# Patient Record
Sex: Female | Born: 1985 | Race: Black or African American | Hispanic: No | Marital: Married
Health system: Midwestern US, Community
[De-identification: ages and names within clinical notes are randomized; demographics above are authoritative.]

---

## 2011-06-14 IMAGING — US US PELV - US TRANSVAGINAL
1 series · 14 of 25 positions shown · non-contrast
Comparison: None.

***ADDENDUM*** CREATED: 10/20/2010 [DATE]

Due to an administrative error, the original report had the
incorrect  exam title.  Please see below for the corrected exam
title.
US PELVIS COMPLETE & US TRANSVAGINAL NON-OB
***END ADDENDUM*** SIGNED BY: Flaco Mitra, M.D.
CLINICAL DATA: Right-sided pelvic pain.  Evaluate for torsion
RADIOLOGY EXAMINATION

[Series 1: us pelv - us transvaginal · 0.24mm/px · 75 acquisitions, 14 frames shown]
[im 1/75]
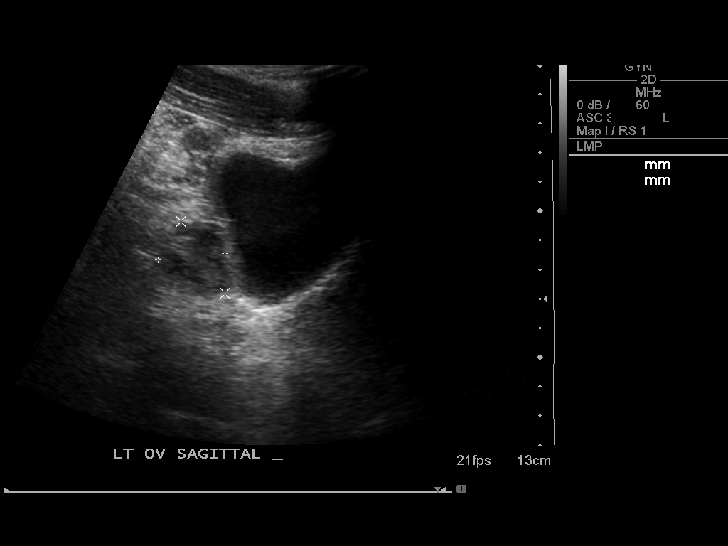
[im 7/75]
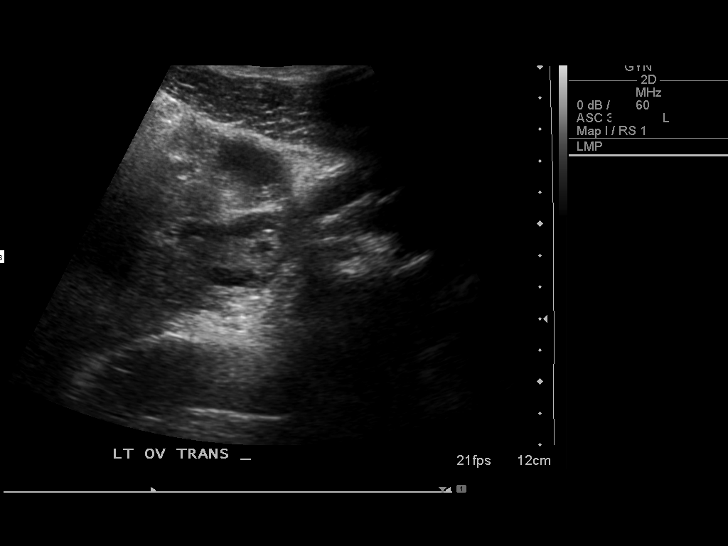
[im 13/75]
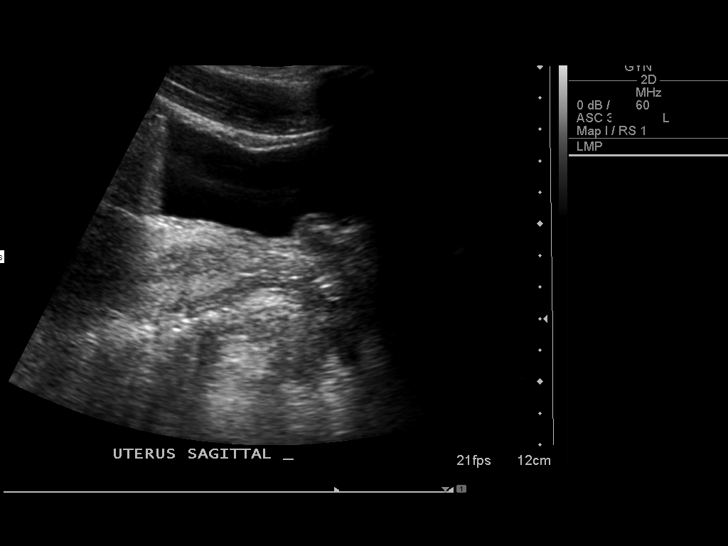
[im 19/75]
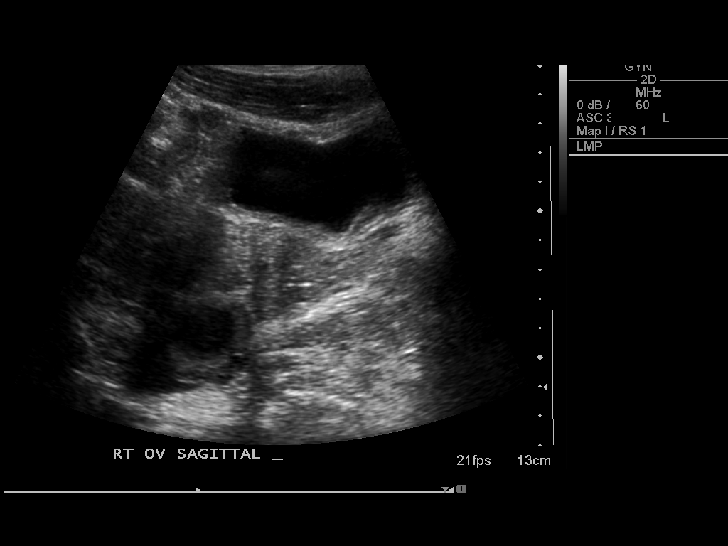
[im 25/75]
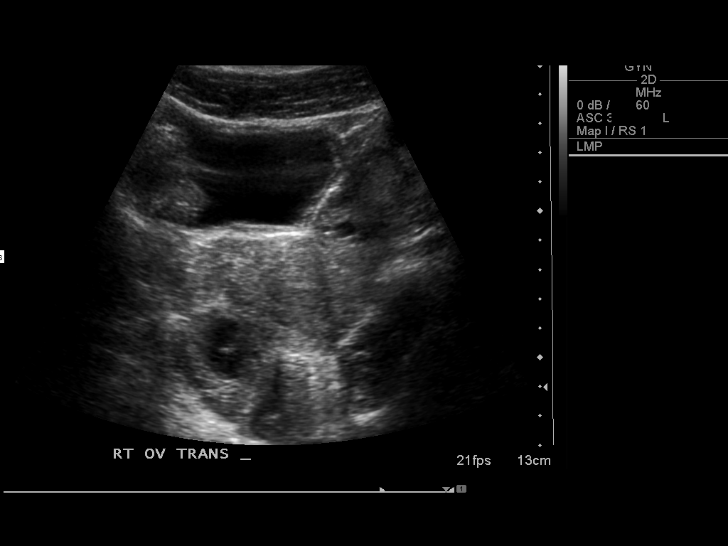
[im 28/75]
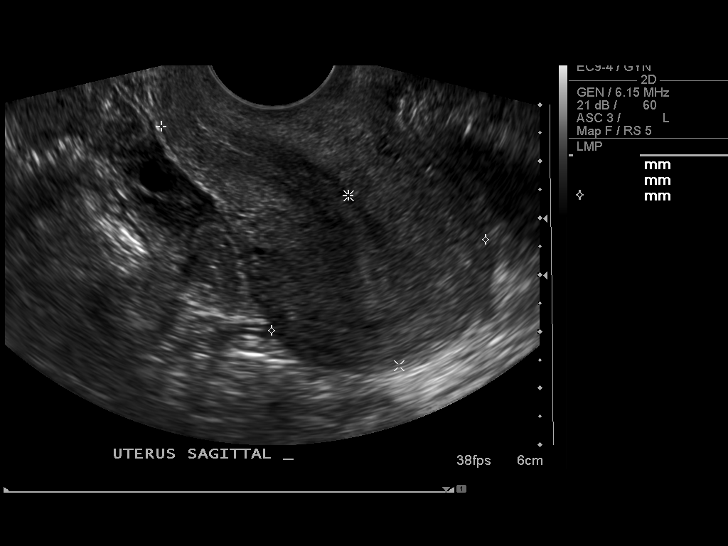
[im 34/75]
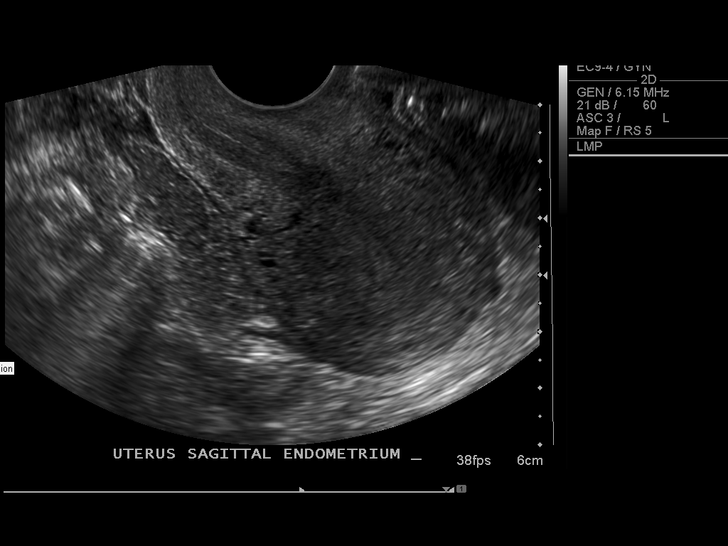
[im 41/75]
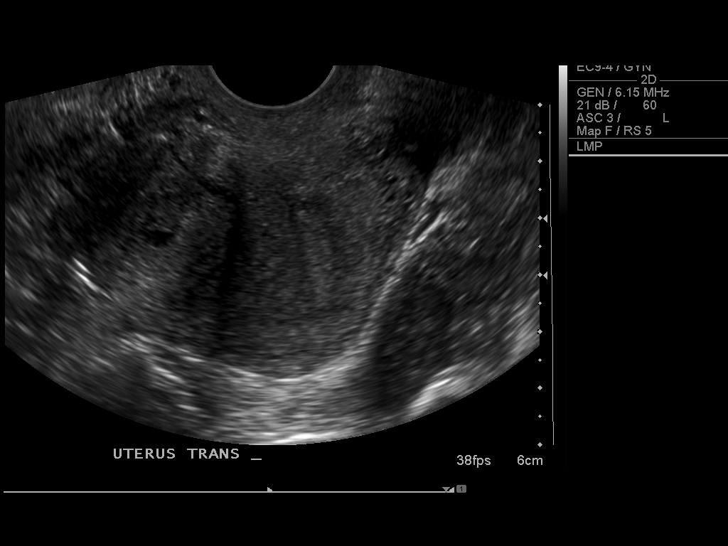
[im 47/75]
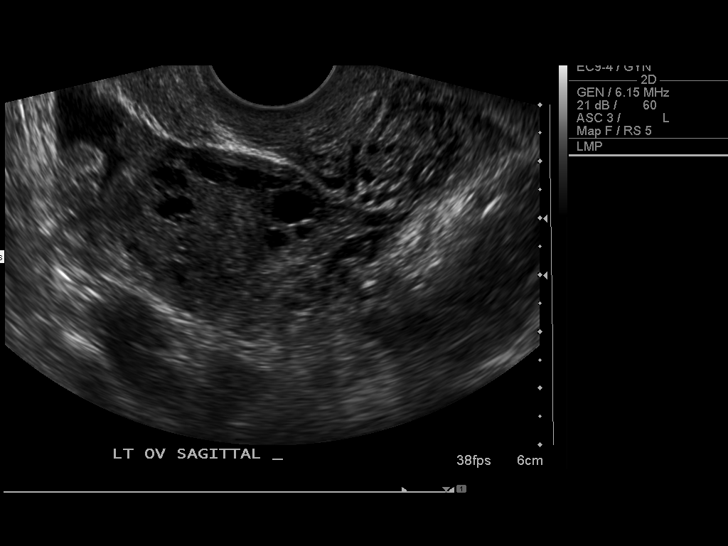
[im 50/75]
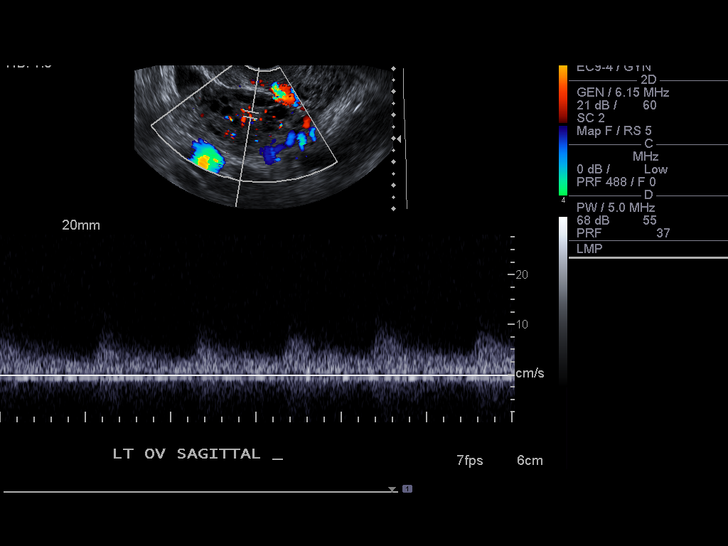
[im 56/75]
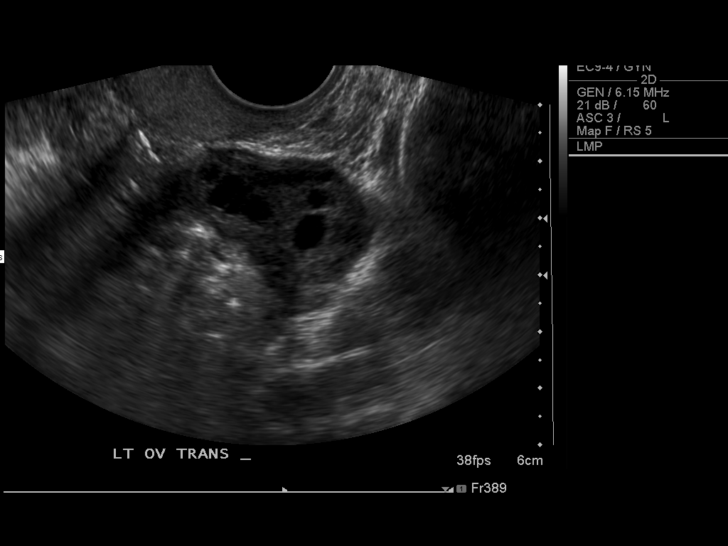
[im 62/75]
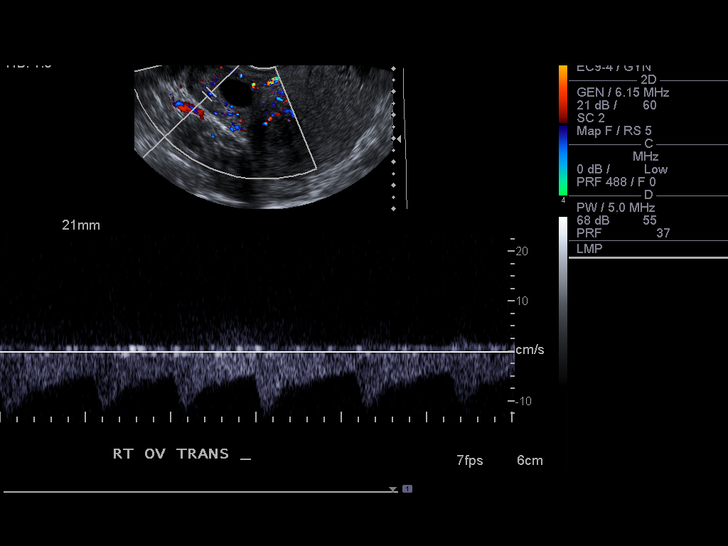
[im 68/75]
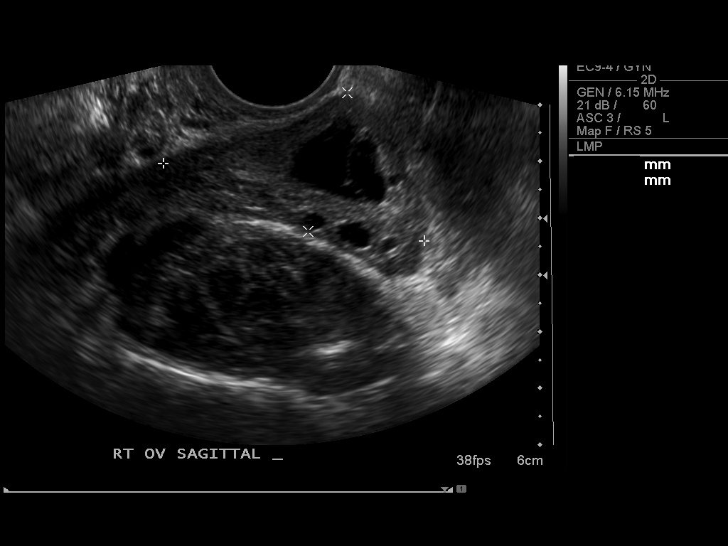
[im 75/75]
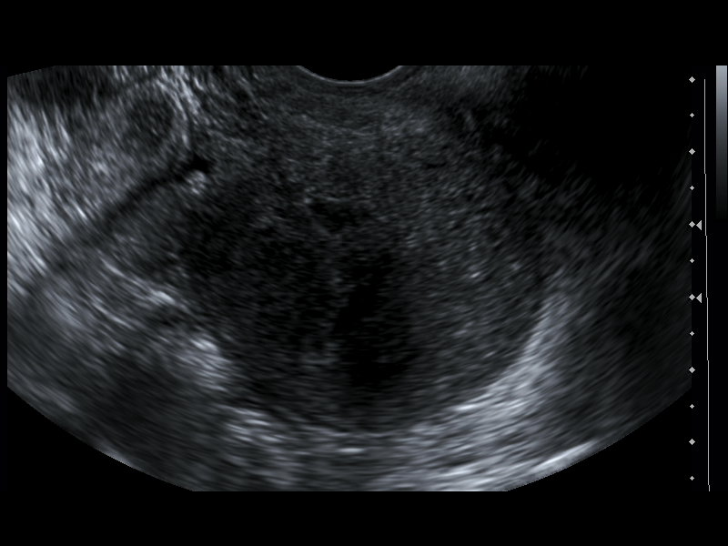

[14 of 25 positions shown; findings below may reference images not displayed]

FINDINGS: Uterus:  Normal in size measuring 6.6 x 4.1 x 5.2 cm.  A
3.2 x 2.9 x 2.4 cm heterogeneously hypoechoic mass is seen along
the right mid to superior body of the uterus displacing the
myometrium compatible with a fibroid.  No other masses are seen.

Right ovary:  4.8 x 2.5 x 3.3 cm.  There is a 2.6 cm anechoic mass
with thin internal septations seen in the right ovary.  Arterial
venous flow is seen in the right ovary.

Left ovary:  2.5 x 3.9 x 3.5 cm.  Normal.  Arterial venous flow is
seen.

No free fluid is seen.
IMPRESSION: 1.  No evidence of ovarian torsion.
2.  Myometrial fibroid as detailed above.
3.  Minimally complex right ovarian cyst likely related to
resolving hemorrhagic cyst.  If there is persistent pain, a repeat
ultrasound may be of use in 6-8 weeks.

## 2013-08-01 ENCOUNTER — Inpatient Hospital Stay
Admit: 2013-08-01 | Discharge: 2013-08-02 | Disposition: A | Discharge status: Home / Self Care | Payer: PRIVATE HEALTH INSURANCE | Source: Other Acute Inpatient Hospital | Attending: Psychiatry | Admitting: Psychiatry

## 2013-08-01 DIAGNOSIS — F4325 Adjustment disorder with mixed disturbance of emotions and conduct: Secondary | ICD-10-CM

## 2013-08-01 NOTE — Progress Notes (Signed)
Chest x ray completed.
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
## 2013-08-01 NOTE — Behavioral Health Treatment Team (Cosign Needed)
patiebnt did not participate in goals group being admitted to unit
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
## 2013-08-01 NOTE — Behavioral Health Treatment Team (Cosign Needed)
Patient did not participate in community meeting

## 2013-08-01 NOTE — Behavioral Health Treatment Team (Signed)
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

## 2013-08-01 NOTE — Behavioral Health Treatment Team (Signed)
nsg note  Client has been in her room most of the shift.  Did have a tellephone call that she got somewhat upset with, but calmed on down and has been watching TV and talking with peers.  Q 15 min checks for safety continue.

## 2013-08-02 LAB — LIPID PANEL
CHOL/HDL Ratio: 2.1 (ref 0–5.0)
Cholesterol, total: 181 MG/DL (ref <200)
HDL Cholesterol: 86 MG/DL
LDL, calculated: 84 MG/DL (ref 0–100)
Triglyceride: 55 MG/DL (ref <150)
VLDL, calculated: 11 MG/DL

## 2013-08-02 LAB — METABOLIC PANEL, COMPREHENSIVE
A-G Ratio: 1.2 (ref 1.1–2.2)
ALT (SGPT): 17 U/L (ref 12–78)
AST (SGOT): 14 U/L — ABNORMAL LOW (ref 15–37)
Albumin: 3.8 g/dL (ref 3.5–5.0)
Alk. phosphatase: 69 U/L (ref 45–117)
Anion gap: 10 mmol/L (ref 5–15)
BUN/Creatinine ratio: 29 — ABNORMAL HIGH (ref 12–20)
BUN: 26 MG/DL — ABNORMAL HIGH (ref 6–20)
Bilirubin, total: 0.7 MG/DL (ref 0.2–1.0)
CO2: 28 mmol/L (ref 21–32)
Calcium: 9.2 MG/DL (ref 8.5–10.1)
Chloride: 103 mmol/L (ref 97–108)
Creatinine: 0.9 MG/DL (ref 0.45–1.15)
GFR est AA: >60 mL/min/{1.73_m2} (ref >60)
GFR est non-AA: >60 mL/min/{1.73_m2} (ref >60)
Globulin: 3.2 g/dL (ref 2.0–4.0)
Glucose: 90 mg/dL (ref 65–100)
Potassium: 4.1 mmol/L (ref 3.5–5.1)
Protein, total: 7 g/dL (ref 6.4–8.2)
Sodium: 141 mmol/L (ref 136–145)

## 2013-08-02 LAB — CBC W/O DIFF
HCT: 38.6 % (ref 35.0–47.0)
HGB: 13.1 g/dL (ref 11.5–16.0)
MCH: 31.3 PG (ref 26.0–34.0)
MCHC: 33.9 g/dL (ref 30.0–36.5)
MCV: 92.1 FL (ref 80.0–99.0)
PLATELET: 256 10*3/uL (ref 150–400)
RBC: 4.19 M/uL (ref 3.80–5.20)
RDW: 12.6 % (ref 11.5–14.5)
WBC: 5.8 10*3/uL (ref 3.6–11.0)

## 2013-08-02 LAB — TSH 3RD GENERATION: TSH: 1.74 u[IU]/mL (ref 0.36–3.74)

## 2013-08-02 LAB — GLUCOSE, FASTING: Glucose: 90 MG/DL (ref 65–100)

## 2013-08-02 MED ADMIN — ibuprofen (MOTRIN) tablet 400 mg: ORAL | NDC 63739067210

## 2013-08-02 MED FILL — IBUPROFEN 400 MG TAB: 400 mg | ORAL | Qty: 1

## 2013-08-02 NOTE — Discharge Summary (Signed)
Patient seen, staffing held and discharge summary dictated: # [id number]  Please see dictated report for full details. Patient stable for discharge and considered to be at low risk of harm to self or others. Pt denies s/i and h/i. Pt fully contracts for safety. Pt reports many positive predictive factors in terms of not attempting suicide or homicide. Informed consent given to the use of discharge medications.  Treatment rounds held in full. All those in treatment rounds   concur with plans for discharge today.     I have spent greater than 35 minutes on discharge work.

## 2013-08-02 NOTE — Behavioral Health Treatment Team (Cosign Needed)
GROUP THERAPY PROGRESS NOTE    Sheila CowingMarkia Edwards is participating in [employer].     Group time: 30 minutes    Personal goal for participation: reality orientation    Goal orientation: social    Group therapy participation: active    Therapeutic interventions reviewed and discussed: yes    Impression of participation: no problem

## 2013-08-02 NOTE — Behavioral Health Treatment Team (Addendum)
Social Work     Pt will be discharged after her TDO hearing. Pt was alert and oriented. Pt denies SI/HI. Pt denies auditory hallucinations. Pt's mood was appropriate. Pt's thoughts were clear and logical. Pt reported  that her friend was going to take possession of gun when she is discharged. Clinician spoke with her friend Leavy CellaJasmine [phone] and she confirmed the plan. Pt was not on amy medications during this hospitalization. Pt will be given community referral if needed    Follow-up   [facility]   [address]  [place], [place] [id number]  [phone]

## 2013-08-02 NOTE — Behavioral Health Treatment Team (Signed)
Art Therapy Group    Michaell CowingMarkia Hartlage attended Art Therapy Group    Group Time: 45 minutes    Treatment Goal for Each Participant: Explore a new way of stress relief by experimenting with more sophisticated art materials including artist's chalk,charcoal, and pastels.    Participation    active    Engagement with Peers   moderate     Impression of Participation: Patient completed two colorful drawings in pastels. Patient eager for discharge today and reported that she is changing the nature of some relationships that have been unhealthy and lead her to angry outbursts and "saying what I do not mean".
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
## 2013-08-02 NOTE — Behavioral Health Treatment Team (Cosign Needed)
GROUP THERAPY PROGRESS NOTE    The patient Michaell CowingMarkia Leidner a 28 y.o. female is participating in Coping Skills Group.     Group time: 45 minutes    Personal goal for participation: To participate in relaxation activity    Goal orientation:  relaxation    Group therapy participation: active    Therapeutic interventions reviewed and discussed: favorite ways to relax    Impression of participation:  The patient was attentive.    BEVERLY S BAKER  08/02/2013  11:54 AM

## 2013-08-02 NOTE — Behavioral Health Treatment Team (Signed)
Pt rested quietly in bed with eyes closed.  No signs/symptoms of distress.  Will monitor for changes.  Q 15 minute checks continue.  2 hour chart audit done. Slept  7   hours since 2200

## 2013-08-02 NOTE — Behavioral Health Treatment Team (Cosign Needed)
GROUP THERAPY PROGRESS NOTE    Sheila CowingMarkia Edwards is participating in Goals Group.     Group time: 30 minutes    Personal goal for participation: to set a daily goal    Goal orientation: personal    Group therapy participation: active    Therapeutic interventions reviewed and discussed: yes    Impression of participation: cooperative

## 2013-08-02 NOTE — Progress Notes (Signed)
Pt is alert and oriented x person, place and time. Pt denies any SI/HI or AV hallucinations. Pt denies any depression. Pt plans to return to continue recommended plan of care. Discharge information reviewed with patient. Pt verbalizes understanding. Pt's belongings/valuables returned. Pt to be transported home by a friend.

## 2013-08-04 NOTE — Behavioral Health Treatment Team (Signed)
Social Work    [facility] [place] [facility].

## 2016-09-18 ENCOUNTER — Inpatient Hospital Stay
Admit: 2016-09-18 | Discharge: 2016-09-18 | Disposition: A | Discharge status: Home / Self Care | Payer: PRIVATE HEALTH INSURANCE | Attending: Emergency Medicine

## 2016-09-18 DIAGNOSIS — F329 Major depressive disorder, single episode, unspecified: Secondary | ICD-10-CM

## 2016-09-18 NOTE — ED Notes (Signed)
4:08 PM  09/18/16     Discharge instructions given to patient (name) with verbalization of understanding. Patient accompanied by family.  Patient discharged with the following prescriptions none. Patient discharged to home (destination).      Johny SaxJennifer L. Guy, RN

## 2016-09-18 NOTE — Progress Notes (Signed)
[facility]-ER CRISIS MENTAL HEALTH SCREENING: This 31 yo single Afro-American female was encouraged to come to this ER by her mother. 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 The patient lives with her female partner.  PROBLEMS: Reportedly the patient has a history of having "emotional highs and emotional lows". The patient's mother reports that she has been getting concerned over the patient's increased frequency and increased intensity of depressed episodes. The mother reports that the patient's female partner expressed concerns to the patient's mother via phone call. Reportedly, yesterday, the patient came home from work and presented depressed mood. Reportedly the patient got emotionally upset when the patient's female friend tried to be verbally supportive. Reportedly the patient started to hit self in the face out of frustration. The patient was quick to tears as she voiced her frustration of not having any control over her mood swings. The patient relates her frustration at the fact that there is no obvious trigger or situational stress that she can be corrected and then everything would be alright. The patient denies any suicidal or homicidal ideas. The patient reportedly is currently seen every several month for Rx psychotropic medication management by the psychiatrist, Dr. Marina GravelAlatise. The patient is also in outpatient psychotherapy through her employer [employer] program. The patient has missed her outpatient psychotherapy (every 2 week) session with Dr Hilliard ClarkVickie Hawkins-Black for the past 2 months. The patient reports belief that the currently Rx Abilify 15 mg po every day, is not effective in controlling her mood swings, especily the frequent depressed mood. The patient reports that the depressed moods are getting more difficulty to "shake off"  There is no evidence of any thought disorder or alterations in perceptions. The patient has a history  of being TDO into inpatient psychiatric intervention for suicidal statements on 08-01-13 to 08-02-13. After being admitted to inpatient psychiatric admission, the patient rescinded her complaint and denied being suicidal and patient was discharged home with outpatient psych follow-up.  IMPRESSION AND RISK ASSESSMENT: Writer discussed the patient with the ER physician. The patient describes a history of mood swings. The patient is currently experiencing depression with increased frequency and increased intensity. The patient voices frustration at not being able to remiss her depressed mood. The patient denies any suicidal or homicidal ideas. The patient is already in outpatient psychiatric psychotropic medication management with a psychiatrist and the patient has taken the initiative to get an outpatient appointment with her psychiatrist tomorrow. The patient reports belief that her current Rx medication regime is no effective in stabilization of her mood. The patient has missed her, every 2 weeks, outpatient psychotherapy session with her therapist for the past 2 months. The patient has also taken the initiative to schedule an outpatient psychotherapy appointment with therapist for next Thursday, 09-24-16.  PLAN: Clinical research associateWriter did provide the patient with other local psychiatric services since the [facility].

## 2016-09-18 NOTE — ED Triage Notes (Signed)
Pt states has been feeling depressed for past week, states has hx of depression and currently takes medications.  Denies SI/HI.  Mother with patient.

## 2016-09-18 NOTE — ED Provider Notes (Signed)
[facility]  [facility] Treatment Report    Patient: Sheila Edwards Age: 31 y.o. Sex: female    Date of Birth: [birth year] Admit Date: 09/18/2016 PCP: None   MRN: [id number]  CSN: [id number]     Room: ER17/ER17 Time Dictated: 1:08 PM        Chief Complaint   Depression   History of Present Illness   31 y.o. female presents to the ED with acute on chronic depression for 3-4 days. Patient states she came into the ED because she was not feeling very happy all week. She states that she feels depressed every couple of weeks. She notes that stressors make her depression worse such as money and work. 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 Her mother states that the patient was found hitting herself in the face this morning and the mother wanted to being her here but she drove off to the beach. Once the mother found the patient she agreed to be seen by a counselor for further evaluation. Patient states hitting herself makes her feel better but she denies cutting herself. She denies having any physical pain, SI, and HI. Patient has been on medication for her depression for 6 months. Denies use of drugs or drinking EtOH.       Review of Systems   Review of Systems   Constitutional: Negative for chills, diaphoresis, fever and weight loss.   HENT: Negative for congestion and sore throat.    Eyes: Negative for blurred vision and double vision.   Respiratory: Negative for cough, sputum production, shortness of breath and wheezing.    Cardiovascular: Negative for chest pain, palpitations, orthopnea and leg swelling.   Gastrointestinal: Negative for abdominal pain, blood in stool, diarrhea, melena, nausea and vomiting.   Genitourinary: Negative for dysuria, frequency and urgency.   Musculoskeletal: Negative for back pain and falls.   Skin: Negative for rash.   Neurological: Negative for dizziness, tingling and headaches.    Endo/Heme/Allergies: Does not bruise/bleed easily.   Psychiatric/Behavioral: Positive for depression. Negative for substance abuse and suicidal ideas.        Negative for homicidal ideations  Positive for self harming including hitting herself but does not cut herself        Past Medical/Surgical History     Past Medical History:   Diagnosis Date   ??? Depression    ??? Psychiatric disorder      History reviewed. No pertinent surgical history.    Social History     Social History     Social History   ??? Marital status: SINGLE     Spouse name: N/A   ??? Number of children: N/A   ??? Years of education: N/A     Social History Main Topics   ??? Smoking status: Never Smoker   ??? Smokeless tobacco: None   ??? Alcohol use None   ??? Drug use: None   ??? Sexual activity: Not Asked     Other Topics Concern   ??? None     Social History Narrative   ??? None       Family History   History reviewed. No pertinent family history.    Current Medications     Current Outpatient Prescriptions   Medication Sig Dispense Refill   ??? ARIPIPRAZOLE (ABILIFY PO) Take  by mouth.         Allergies   No Known Allergies  Physical Exam     Visit Vitals   ??? BP 124/77 (BP 1 Location: Left arm, BP Patient Position: At rest)   ??? Pulse 97   ??? Temp 98.9 ??F (37.2 ??C)   ??? Resp 15   ??? Ht 5\' 2"  (1.575 m)   ??? Wt 54.9 kg (121 lb)   ??? LMP 09/17/2016   ??? SpO2 97%   ??? BMI 22.13 kg/m2     Physical Exam   Constitutional: She is oriented to person, place, and time and well-developed, well-nourished, and in no distress. No distress.   HENT:   Head: Normocephalic and atraumatic.   No periorbital tenderness  Good dentition  No bleeding noted   Eyes: Conjunctivae and EOM are normal. Pupils are equal, round, and reactive to light.   Normal orbital tenderness   Neck: Normal range of motion. Neck supple. No tracheal deviation present. No thyromegaly present.   No neck pain    Cardiovascular: Normal rate, regular rhythm and normal heart sounds.  Exam  reveals no gallop and no friction rub.    No murmur heard.  Pulmonary/Chest: Effort normal and breath sounds normal. No stridor. No respiratory distress. She has no wheezes. She has no rales.   Lungs are clear to ausculation bilaterally.     Abdominal: Soft. Bowel sounds are normal. She exhibits no mass. There is no tenderness. There is no rebound and no guarding.   Musculoskeletal: Normal range of motion. She exhibits no edema or tenderness.   Equal bilateral motor strength   Lymphadenopathy:     She has no cervical adenopathy.   Neurological: She is alert and oriented to person, place, and time.   Skin: Skin is warm and dry. No rash noted. She is not diaphoretic.   Nursing note and vitals reviewed.      Impression and Management Plan   31 year old female with recurrent depression here today with depressive symptoms, she is not suicidal, she is not homicidal, she is just plain old sad.  She does have some putting things in her life, she enjoys working with some of the children at her work, she does go to [employer] and that makes her feel happy.  I do not feel she requires hospitalization, we will have our crisis team come and evaluate her, likely will need expedited outpatient follow-up.    Diagnostic Studies   Lab:   No results found for this or any previous visit (from the past 12 hour(s)).    Imaging:    No results found.    Medical Decision Making/ED Course   Patient was seen by Gerline Legacy, our crisis clinician, agrees that expeditious outpatient follow-up is reasonable, patient is nonsuicidal, non-homicidal for him, has follow-up scheduled for tomorrow and a first thing next week, and I feel that is reasonable, advised return to the ER for any suicidal or intrusive concerning thoughts.    Final Diagnosis       ICD-10-CM ICD-9-CM   1. Depression, unspecified depression type F32.9 311     Disposition   Discharge to home    Scribe Attestation      [employer] acting as a Neurosurgeon for and in the presence of Wynelle Bourgeois, MD      September 18, 2016 at 1:08 PM       Provider Attestation:      I personally performed the services described in the documentation, reviewed the documentation, as recorded by the scribe in my presence, and  it accurately and completely records my words and actions. September 18, 2016 at 1:08 PM - Wynelle Bourgeois, MD        Wynelle Bourgeois, MD  September 19, 2016    My signature above authenticates this document and my orders, the final ??  diagnosis (es), discharge prescription (s), and instructions in the Epic ??  record.  If you have any questions please contact [phone].  ??  Nursing notes have been reviewed by the physician/ advanced practice ??  Clinician.

## 2017-10-13 ENCOUNTER — Inpatient Hospital Stay
Admit: 2017-10-13 | Discharge: 2017-10-13 | Disposition: A | Discharge status: Home / Self Care | Payer: PRIVATE HEALTH INSURANCE | Attending: Emergency Medicine

## 2017-10-13 DIAGNOSIS — N946 Dysmenorrhea, unspecified: Secondary | ICD-10-CM

## 2017-10-13 LAB — LIPASE: Lipase: 242 U/L (ref 73–393)

## 2017-10-13 LAB — CBC WITH AUTOMATED DIFF
BASOPHILS: 0.3 % (ref 0–3)
EOSINOPHILS: 0.1 % (ref 0–5)
HCT: 38.9 % (ref 37.0–50.0)
HGB: 13.5 gm/dl (ref 13.0–17.2)
IMMATURE GRANULOCYTES: 0.4 % (ref 0.0–3.0)
LYMPHOCYTES: 7.9 % — ABNORMAL LOW (ref 28–48)
MCH: 32.3 pg (ref 25.4–34.6)
MCHC: 34.7 gm/dl (ref 30.0–36.0)
MCV: 93.1 fL (ref 80.0–98.0)
MONOCYTES: 4 % (ref 1–13)
MPV: 9.3 fL (ref 6.0–10.0)
NEUTROPHILS: 87.3 % — ABNORMAL HIGH (ref 34–64)
NRBC: 0 (ref 0–0)
PLATELET: 231 10*3/uL (ref 140–450)
RBC: 4.18 M/uL (ref 3.60–5.20)
RDW-SD: 46.1 (ref 36.4–46.3)
WBC: 14.8 10*3/uL — ABNORMAL HIGH (ref 4.0–11.0)

## 2017-10-13 LAB — POC URINE MICROSCOPIC

## 2017-10-13 LAB — METABOLIC PANEL, COMPREHENSIVE
ALT (SGPT): 62 U/L (ref 12–78)
AST (SGOT): 81 U/L — ABNORMAL HIGH (ref 15–37)
Albumin: 4.9 gm/dl (ref 3.4–5.0)
Alk. phosphatase: 85 U/L (ref 45–117)
Anion gap: 10 mmol/L (ref 5–15)
BUN: 20 mg/dl (ref 7–25)
Bilirubin, total: 0.8 mg/dl (ref 0.2–1.0)
CO2: 26 mEq/L (ref 21–32)
Calcium: 9.6 mg/dl (ref 8.5–10.1)
Chloride: 106 mEq/L (ref 98–107)
Creatinine: 1 mg/dl (ref 0.6–1.3)
GFR est AA: >60
GFR est non-AA: >60
Glucose: 132 mg/dl — ABNORMAL HIGH (ref 74–106)
Potassium: 3.1 mEq/L — ABNORMAL LOW (ref 3.5–5.1)
Protein, total: 8.4 gm/dl — ABNORMAL HIGH (ref 6.4–8.2)
Sodium: 141 mEq/L (ref 136–145)

## 2017-10-13 LAB — POC URINE MACROSCOPIC
Glucose: NEGATIVE mg/dl
Ketone: >=160 mg/dl — AB
Nitrites: NEGATIVE
Protein: 100 mg/dl — AB
Specific gravity: 1.015 (ref 1.005–1.030)
Urobilinogen: 1 EU/dl (ref 0.0–1.0)
pH (UA): >=9 (ref 5–9)

## 2017-10-13 MED ORDER — ONDANSETRON 4 MG TAB, RAPID DISSOLVE
4 mg | ORAL_TABLET | Freq: Three times a day (TID) | ORAL | 0 refills | Status: DC | PRN
Start: 2017-10-13 — End: 2020-01-30

## 2017-10-13 MED ORDER — SODIUM CHLORIDE 0.9% BOLUS IV
0.9 % | INTRAVENOUS | Status: AC
Start: 2017-10-13 — End: 2017-10-13
  Administered 2017-10-13: 06:00:00 via INTRAVENOUS

## 2017-10-13 MED ORDER — CEFTRIAXONE 1 GRAM SOLUTION FOR INJECTION
1 gram | INTRAMUSCULAR | Status: AC
Start: 2017-10-13 — End: 2017-10-13
  Administered 2017-10-13: 06:00:00 via INTRAVENOUS

## 2017-10-13 MED ORDER — KETOROLAC TROMETHAMINE 30 MG/ML INJECTION
30 mg/mL (1 mL) | INTRAMUSCULAR | Status: AC
Start: 2017-10-13 — End: 2017-10-13
  Administered 2017-10-13: 06:00:00 via INTRAVENOUS

## 2017-10-13 MED ORDER — TRAMADOL 50 MG TAB
50 mg | ORAL | Status: AC
Start: 2017-10-13 — End: 2017-10-13
  Administered 2017-10-13: 06:00:00 via ORAL

## 2017-10-13 MED ORDER — TRAMADOL 50 MG TAB
50 mg | ORAL_TABLET | Freq: Four times a day (QID) | ORAL | 0 refills | Status: AC | PRN
Start: 2017-10-13 — End: 2017-10-16

## 2017-10-13 MED ORDER — SODIUM CHLORIDE 0.9 % IJ SYRG
Freq: Once | INTRAMUSCULAR | Status: DC
Start: 2017-10-13 — End: 2017-10-13

## 2017-10-13 MED ORDER — ONDANSETRON (PF) 4 MG/2 ML INJECTION
4 mg/2 mL | Freq: Once | INTRAMUSCULAR | Status: AC
Start: 2017-10-13 — End: 2017-10-13
  Administered 2017-10-13: 06:00:00 via INTRAVENOUS

## 2017-10-13 MED FILL — ONDANSETRON (PF) 4 MG/2 ML INJECTION: 4 mg/2 mL | INTRAMUSCULAR | Qty: 2

## 2017-10-13 MED FILL — CEFTRIAXONE 1 GRAM SOLUTION FOR INJECTION: 1 gram | INTRAMUSCULAR | Qty: 1

## 2017-10-13 MED FILL — KETOROLAC TROMETHAMINE 30 MG/ML INJECTION: 30 mg/mL (1 mL) | INTRAMUSCULAR | Qty: 1

## 2017-10-13 MED FILL — TRAMADOL 50 MG TAB: 50 mg | ORAL | Qty: 1

## 2017-10-13 NOTE — ED Provider Notes (Signed)
[facility]  Emergency Department Treatment Report    Patient: Sheila Edwards Age: 32 y.o. Sex: female    Date of Birth: [birth year] Admit Date: 10/13/2017 PCP: None   MRN: [id number]  CSN: [id number]  Attending: Helaine Chess, MD   Room: OTF/OTF Time Dictated: 1:20 AM APP: Hilton Sinclair, PA-C     Chief Complaint   Chief Complaint   Patient presents with   ??? Abdominal Pain   ??? Vomiting        History of Present Illness   32 y.o. female who presents to the ED with severe menstrual cramps with nausea and vomiting that began approximately 10 AM this morning.  Patient states that she has endometriosis and this is consistent with her cramping.  She has taken Aleve and Tylenol without relief.  Patient does state that her menstrual cycle started today.  Pain is suprapubic and does not radiate.  She also states that she has been having hot flashes and diaphoresis.    Review of Systems   Review of Systems   Constitutional: Positive for chills, diaphoresis and fever.   HENT: Negative for congestion and sinus pain.    Eyes: Negative for blurred vision and double vision.   Respiratory: Negative for cough, hemoptysis and shortness of breath.    Cardiovascular: Negative for chest pain.   Gastrointestinal: Positive for abdominal pain, nausea and vomiting. Negative for constipation and diarrhea.   Genitourinary: Negative for dysuria, frequency and urgency.   Musculoskeletal: Positive for myalgias. Negative for back pain.   Skin: Negative for rash.   Neurological: Negative for dizziness and headaches.   Endo/Heme/Allergies: Does not bruise/bleed easily.   Psychiatric/Behavioral: Positive for substance abuse.   All other systems reviewed and are negative.      Past Medical/Surgical History     Past Medical History:   Diagnosis Date   ??? Depression    ??? Endometriosis    ??? Psychiatric disorder      Past Surgical History:   Procedure Laterality Date   ??? HX PELVIC LAPAROSCOPY  2001       Social History      Social History     Socioeconomic History   ??? Marital status: SINGLE     Spouse name: Not on file   ??? Number of children: Not on file   ??? Years of education: Not on file   ??? Highest education level: Not on file   Tobacco Use   ??? Smoking status: Never Smoker   ??? Smokeless tobacco: Never Used   Substance and Sexual Activity   ??? Alcohol use: Yes     Frequency: Monthly or less     Comment: occ   ??? Drug use: Yes     Types: Marijuana     Comment: 1 blunt daily   ??? Sexual activity: Yes       Family History     Family History   Problem Relation Age of Onset   ??? No Known Problems Mother    ??? No Known Problems Father        Current Medications     Prior to Admission Medications   Prescriptions Last Dose Informant Patient Reported? Taking?   cariprazine HCl (VRAYLAR PO) 10/13/2017 at Unknown time  Yes Yes   Sig: Take  by mouth.      Facility-Administered Medications: None       Allergies   No Known Allergies    Physical Exam  ED Triage Vitals   ED Encounter Vitals Group      BP 10/13/17 0100 109/64      Pulse (Heart Rate) 10/13/17 0100 60      Resp Rate 10/13/17 0100 18      Temp 10/13/17 0100 97.9 ??F (36.6 ??C)      Temp src --       O2 Sat (%) 10/13/17 0100 100 %      Weight 10/13/17 0056 135 lb      Height 10/13/17 0056 5' 4"        Physical Exam   Constitutional: She is oriented to person, place, and time. Vital signs are normal. She appears to be writhing in pain. She appears distressed.   HENT:   Head: Normocephalic and atraumatic.   Right Ear: External ear normal.   Left Ear: External ear normal.   Nose: Nose normal.   Mouth/Throat: Oropharynx is clear and moist.   Eyes: Pupils are equal, round, and reactive to light. Conjunctivae and EOM are normal.   No nystagmus   Neck: Normal range of motion. Neck supple.   Cardiovascular: Normal rate, regular rhythm, normal heart sounds and intact distal pulses.   No murmur heard.  Pulmonary/Chest: Effort normal and breath sounds normal. No respiratory  distress. She has no wheezes. She has no rales.   Abdominal: Soft. Bowel sounds are normal. She exhibits no distension. There is tenderness in the suprapubic area.   Musculoskeletal: Normal range of motion. She exhibits no edema or deformity.   Neurological: She is alert and oriented to person, place, and time. No cranial nerve deficit. GCS score is 15.   Skin: Skin is warm. No rash noted. She is diaphoretic. No erythema.   Psychiatric: Mood, memory, affect and judgment normal.   Nursing note and vitals reviewed.      Impression and Management Plan   32 y.o. female presents with dysmenorrhea consistent with previous episodes.  We will go ahead and check a UA, CBC and abdominal labs to rule out other pathology.  We will go ahead and give her a dose of IV Toradol now.    Diagnostic Studies   Lab:   Recent Results (from the past 12 hour(s))   CBC WITH AUTOMATED DIFF    Collection Time: 10/13/17  1:21 AM   Result Value Ref Range    WBC 14.8 (H) 4.0 - 11.0 1000/mm3    RBC 4.18 3.60 - 5.20 M/uL    HGB 13.5 13.0 - 17.2 gm/dl    HCT 38.9 37.0 - 50.0 %    MCV 93.1 80.0 - 98.0 fL    MCH 32.3 25.4 - 34.6 pg    MCHC 34.7 30.0 - 36.0 gm/dl    PLATELET 231 140 - 450 1000/mm3    MPV 9.3 6.0 - 10.0 fL    RDW-SD 46.1 36.4 - 46.3      NRBC 0 0 - 0      IMMATURE GRANULOCYTES 0.4 0.0 - 3.0 %    NEUTROPHILS 87.3 (H) 34 - 64 %    LYMPHOCYTES 7.9 (L) 28 - 48 %    MONOCYTES 4.0 1 - 13 %    EOSINOPHILS 0.1 0 - 5 %    BASOPHILS 0.3 0 - 3 %   LIPASE    Collection Time: 10/13/17  1:21 AM   Result Value Ref Range    Lipase 242 73 - 696 U/L   METABOLIC PANEL, COMPREHENSIVE    Collection Time: 10/13/17  1:21 AM   Result Value Ref Range    Sodium 141 136 - 145 mEq/L    Potassium 3.1 (L) 3.5 - 5.1 mEq/L    Chloride 106 98 - 107 mEq/L    CO2 26 21 - 32 mEq/L    Glucose 132 (H) 74 - 106 mg/dl    BUN 20 7 - 25 mg/dl    Creatinine 1.0 0.6 - 1.3 mg/dl    GFR est AA >60.0      GFR est non-AA >60      Calcium 9.6 8.5 - 10.1 mg/dl     AST (SGOT) 81 (H) 15 - 37 U/L    ALT (SGPT) 62 12 - 78 U/L    Alk. phosphatase 85 45 - 117 U/L    Bilirubin, total 0.8 0.2 - 1.0 mg/dl    Protein, total 8.4 (H) 6.4 - 8.2 gm/dl    Albumin 4.9 3.4 - 5.0 gm/dl    Anion gap 10 5 - 15 mmol/L   POC URINE MACROSCOPIC    Collection Time: 10/13/17  1:24 AM   Result Value Ref Range    Glucose Negative NEGATIVE,Negative mg/dl    Bilirubin Small (A) NEGATIVE,Negative      Ketone >=160 (A) NEGATIVE,Negative mg/dl    Specific gravity 1.015 1.005 - 1.030      Blood Large (A) NEGATIVE,Negative      pH (UA) >=9.0 5 - 9      Protein 100 (A) NEGATIVE,Negative mg/dl    Urobilinogen 1.0 0.0 - 1.0 EU/dl    Nitrites Negative NEGATIVE,Negative      Leukocyte Esterase Trace (A) NEGATIVE,Negative      Color Dark yellow      Appearance Slightly Cloudy     POC URINE MICROSCOPIC    Collection Time: 10/13/17  1:24 AM   Result Value Ref Range    Epithelial cells, squamous 5-9 /LPF    WBC 1-4 /HPF    RBC OCCASIONAL /HPF    Mucus PRESENT      Bacteria OCCASIONAL /HPF       ED Course     Patient Vitals for the past 12 hrs:   Temp Pulse Resp BP SpO2   10/13/17 0100 97.9 ??F (36.6 ??C) 60 18 109/64 100 %       ED Course as of Oct 14 322   Wed Oct 13, 2017   0214 Reviewed labs.  Patient has a UTI.  We will go ahead and give her 1 dose of Rocephin to treat.    [SH]   0215 Patient also complains that pain is not improved after Toradol.  We will go ahead and give her 1 tramadol 50 mg which is what she has been previously given at other facilities    [SH]      ED Course User Index  [SH] Hilton Sinclair, PA-C       Medications   sodium chloride (NS) flush 5-10 mL (has no administration in time range)   sodium chloride 0.9 % bolus infusion 1,000 mL (1,000 mL IntraVENous New Bag 10/13/17 0152)   ondansetron (ZOFRAN) injection 4 mg (4 mg IntraVENous Given 10/13/17 0152)   ketorolac (TORADOL) injection 30 mg (30 mg IntraVENous Given 10/13/17 0153)    cefTRIAXone (ROCEPHIN) 1 g in 0.9% sodium chloride (MBP/ADV) 50 mL MBP (1 g IntraVENous New Bag 10/13/17 0225)   traMADol (ULTRAM) tablet 50 mg (50 mg Oral Given 10/13/17 0218)       Medical Decision Making  Patient with menstrual cramps and nausea that have improved after IV Toradol and p.o. tramadol.  She does also have a superimposed UTI that we will treat with Rocephin while she is here in the emergency room.  Patient is advised to follow-up with OB/GYN to have consideration for suppression medicine for her endometriosis.  She will also be discharged with a work note.    Final Diagnosis       ICD-10-CM ICD-9-CM   1. Dysmenorrhea N94.6 625.3   2. Acute cystitis with hematuria N30.01 595.0   3. Endometriosis N80.9 617.9       Disposition   Discharge to home    Current Discharge Medication List      START taking these medications    Details   ondansetron (ZOFRAN ODT) 4 mg disintegrating tablet Take 1 Tab by mouth every eight (8) hours as needed for Nausea. Indications: nausea  Qty: 12 Tab, Refills: 0      traMADol (ULTRAM) 50 mg tablet Take 1 Tab by mouth every six (6) hours as needed for Pain for up to 3 days. Max Daily Amount: 200 mg.  Qty: 12 Tab, Refills: 0    Associated Diagnoses: Dysmenorrhea           Hilton Sinclair, PA-C  October 13, 2017  1:20 AM    The patient was personally evaluated by myself and MANOLIO, Janine Ores, MD who agrees with the above assessment and plan.    My signature above authenticates this document and my orders, the final diagnosis(es), discharge prescription (s), and instructions in the Epic record. If you have any questions please contact [phone].  ??  Nursing notes have been reviewed by the Physician/Advanced Practice Clinician. Dragon medical dictation software was used for portions of this report. Unintended voice recognition errors may occur.

## 2017-10-13 NOTE — ED Triage Notes (Addendum)
Pt has endometriosis and started her cycle today.  States the pain and vomiting are the usual sxs he has, but they are worse and not relieved by OTC meds. Pt denies any urinary sxs and no vag dc

## 2018-11-05 ENCOUNTER — Emergency Department: Admit: 2018-11-05 | Payer: PRIVATE HEALTH INSURANCE

## 2018-11-05 ENCOUNTER — Inpatient Hospital Stay
Admit: 2018-11-05 | Discharge: 2018-11-05 | Disposition: A | Discharge status: Home / Self Care | Payer: PRIVATE HEALTH INSURANCE | Attending: Emergency Medicine

## 2018-11-05 DIAGNOSIS — N83201 Unspecified ovarian cyst, right side: Secondary | ICD-10-CM

## 2018-11-05 LAB — POC URINE MACROSCOPIC
Bilirubin, Urine: NEGATIVE
Bilirubin: NEGATIVE
Glucose, Ur: NEGATIVE mg/dl
Glucose: NEGATIVE mg/dl
Ketone: 15 mg/dl — AB
Ketones, Urine: 15 mg/dl — AB
Leukocyte Esterase, Urine: NEGATIVE
Leukocyte Esterase: NEGATIVE
Nitrite, Urine: NEGATIVE
Nitrites: NEGATIVE
Protein, UA: NEGATIVE mg/dl
Protein: NEGATIVE mg/dl
Specific Gravity, UA: 1.015 (ref 1.005–1.030)
Specific gravity: 1.015 (ref 1.005–1.030)
Urobilinogen, UA, POCT: 0.2 EU/dl (ref 0.0–1.0)
Urobilinogen: 0.2 EU/dl (ref 0.0–1.0)
pH (UA): 7.5 (ref 5–9)
pH, UA: 7.5 (ref 5–9)

## 2018-11-05 LAB — COMPREHENSIVE METABOLIC PANEL
ALT: 18 U/L (ref 12–78)
AST: 12 U/L — ABNORMAL LOW (ref 15–37)
Albumin: 3.6 gm/dl (ref 3.4–5.0)
Alkaline Phosphatase: 91 U/L (ref 45–117)
Anion Gap: 9 mmol/L (ref 5–15)
BUN: 17 mg/dl (ref 7–25)
CO2: 26 mEq/L (ref 21–32)
Calcium: 9.4 mg/dl (ref 8.5–10.1)
Chloride: 106 mEq/L (ref 98–107)
Creatinine: 0.9 mg/dl (ref 0.6–1.3)
EGFR IF NonAfrican American: >60
GFR African American: >60
Glucose: 87 mg/dl (ref 74–106)
Potassium: 4.1 mEq/L (ref 3.5–5.1)
Sodium: 140 mEq/L (ref 136–145)
Total Bilirubin: 0.4 mg/dl (ref 0.2–1.0)
Total Protein: 8 gm/dl (ref 6.4–8.2)

## 2018-11-05 LAB — CBC WITH AUTO DIFFERENTIAL
Basophils %: 0.3 % (ref 0–3)
Eosinophils %: 0.5 % (ref 0–5)
Hematocrit: 38.9 % (ref 37.0–50.0)
Hemoglobin: 13 gm/dl (ref 13.0–17.2)
Immature Granulocytes: 0.2 % (ref 0.0–3.0)
Lymphocytes %: 22.3 % — ABNORMAL LOW (ref 28–48)
MCH: 31.9 pg (ref 25.4–34.6)
MCHC: 33.4 gm/dl (ref 30.0–36.0)
MCV: 95.3 fL (ref 80.0–98.0)
MPV: 9.1 fL (ref 6.0–10.0)
Monocytes %: 8.4 % (ref 1–13)
Neutrophils %: 68.3 % — ABNORMAL HIGH (ref 34–64)
Nucleated RBCs: 0 (ref 0–0)
Platelets: 265 10*3/uL (ref 140–450)
RBC: 4.08 M/uL (ref 3.60–5.20)
RDW-SD: 44 (ref 36.4–46.3)
WBC: 5.9 10*3/uL (ref 4.0–11.0)

## 2018-11-05 LAB — LIPASE
Lipase: 84 U/L (ref 73–393)
Lipase: 84 U/L (ref 73–393)

## 2018-11-05 LAB — POC HCG,URINE
HCG urine, QL: NEGATIVE
Pregnancy Test(Urn): NEGATIVE

## 2018-11-05 LAB — CBC WITH AUTOMATED DIFF
BASOPHILS: 0.3 % (ref 0–3)
EOSINOPHILS: 0.5 % (ref 0–5)
HCT: 38.9 % (ref 37.0–50.0)
HGB: 13 gm/dl (ref 13.0–17.2)
IMMATURE GRANULOCYTES: 0.2 % (ref 0.0–3.0)
LYMPHOCYTES: 22.3 % — ABNORMAL LOW (ref 28–48)
MCH: 31.9 pg (ref 25.4–34.6)
MCHC: 33.4 gm/dl (ref 30.0–36.0)
MCV: 95.3 fL (ref 80.0–98.0)
MONOCYTES: 8.4 % (ref 1–13)
MPV: 9.1 fL (ref 6.0–10.0)
NEUTROPHILS: 68.3 % — ABNORMAL HIGH (ref 34–64)
NRBC: 0 (ref 0–0)
PLATELET: 265 10*3/uL (ref 140–450)
RBC: 4.08 M/uL (ref 3.60–5.20)
RDW-SD: 44 (ref 36.4–46.3)
WBC: 5.9 10*3/uL (ref 4.0–11.0)

## 2018-11-05 LAB — METABOLIC PANEL, COMPREHENSIVE
ALT (SGPT): 18 U/L (ref 12–78)
AST (SGOT): 12 U/L — ABNORMAL LOW (ref 15–37)
Albumin: 3.6 gm/dl (ref 3.4–5.0)
Alk. phosphatase: 91 U/L (ref 45–117)
Anion gap: 9 mmol/L (ref 5–15)
BUN: 17 mg/dl (ref 7–25)
Bilirubin, total: 0.4 mg/dl (ref 0.2–1.0)
CO2: 26 mEq/L (ref 21–32)
Calcium: 9.4 mg/dl (ref 8.5–10.1)
Chloride: 106 mEq/L (ref 98–107)
Creatinine: 0.9 mg/dl (ref 0.6–1.3)
GFR est AA: >60
GFR est non-AA: >60
Glucose: 87 mg/dl (ref 74–106)
Potassium: 4.1 mEq/L (ref 3.5–5.1)
Protein, total: 8 gm/dl (ref 6.4–8.2)
Sodium: 140 mEq/L (ref 136–145)

## 2018-11-05 MED ORDER — SODIUM CHLORIDE 0.9 % IJ SYRG
Freq: Once | INTRAMUSCULAR | Status: AC
Start: 2018-11-05 — End: 2018-11-05
  Administered 2018-11-05: 17:00:00 via INTRAVENOUS

## 2018-11-05 MED ORDER — ONDANSETRON (PF) 4 MG/2 ML INJECTION
4 mg/2 mL | Freq: Once | INTRAMUSCULAR | Status: AC
Start: 2018-11-05 — End: 2018-11-05
  Administered 2018-11-05: 18:00:00 via INTRAVENOUS

## 2018-11-05 MED ORDER — SODIUM CHLORIDE 0.9% BOLUS IV
0.9 % | INTRAVENOUS | Status: AC
Start: 2018-11-05 — End: 2018-11-05
  Administered 2018-11-05: 18:00:00 via INTRAVENOUS

## 2018-11-05 MED ORDER — HYDROCODONE-ACETAMINOPHEN 5 MG-325 MG TAB
5-325 mg | ORAL_TABLET | Freq: Four times a day (QID) | ORAL | 0 refills | Status: AC | PRN
Start: 2018-11-05 — End: 2018-11-08

## 2018-11-05 MED ORDER — MORPHINE 4 MG/ML SYRINGE
4 mg/mL | INTRAMUSCULAR | Status: AC
Start: 2018-11-05 — End: 2018-11-05
  Administered 2018-11-05: 18:00:00 via INTRAVENOUS

## 2018-11-05 MED ORDER — HYDROCODONE-ACETAMINOPHEN 5 MG-325 MG TAB
5-325 mg | ORAL | Status: AC
Start: 2018-11-05 — End: 2018-11-05
  Administered 2018-11-05: 21:00:00 via ORAL

## 2018-11-05 MED ORDER — IBUPROFEN 600 MG TAB
600 mg | ORAL_TABLET | Freq: Four times a day (QID) | ORAL | 0 refills | Status: DC | PRN
Start: 2018-11-05 — End: 2020-01-30

## 2018-11-05 MED ORDER — IOPAMIDOL 61 % IV SOLN
300 mg iodine /mL (61 %) | Freq: Once | INTRAVENOUS | Status: AC
Start: 2018-11-05 — End: 2018-11-05
  Administered 2018-11-05: 17:00:00 via INTRAVENOUS

## 2018-11-05 MED FILL — ISOVUE-300  61 % INTRAVENOUS SOLUTION: 300 mg iodine /mL (61 %) | INTRAVENOUS | Qty: 75

## 2018-11-05 MED FILL — ONDANSETRON (PF) 4 MG/2 ML INJECTION: 4 mg/2 mL | INTRAMUSCULAR | Qty: 2

## 2018-11-05 MED FILL — HYDROCODONE-ACETAMINOPHEN 5 MG-325 MG TAB: 5-325 mg | ORAL | Qty: 1

## 2018-11-05 MED FILL — MORPHINE 4 MG/ML SYRINGE: 4 mg/mL | INTRAMUSCULAR | Qty: 1

## 2018-11-05 NOTE — ED Provider Notes (Signed)
ED  Provider Notes by Hollie Salk, PA at 11/05/18 1310                Author: Hollie Salk, [place]  Service: EMERGENCY  Author Type: Physician Assistant       Filed: 11/05/18 1658  Date of Service: 11/05/18 1310  Status: Attested           Editor: Hollie Salk, [place] (Physician Assistant)  Cosigner: Lennox Laity, MD at 11/05/18 1718          Attestation signed by Lennox Laity, MD at 11/05/18 1718          I, Lennox Laity, MD , have personally seen and examined this patient; I have fully participated in the care of this patient with the advanced practice provider.   I have reviewed and agree with all pertinent clinical information including history, physical exam, studies and the plan.  I have also reviewed and agree with the medications, allergies and past medical history sections for this patient.      Lennox Laity, MD   Nov 05, 2018   Repeat examination after the ultrasound she had mild right lower quadrant pain was more comfortable with stable vital signs.  I talked her about the hemorrhagic ovarian cyst.  She will follow-up with Dr. Martinique as a regular scheduled appointment on May  7.  She will take anti-inflammatories and return if symptoms worsen acutely.                                 [facility]   Emergency Department Treatment Report          Patient: Sheila Edwards  Age: 33 y.o.  Sex: female          Date of Birth: [birth year]  Admit Date: 11/05/2018  PCP: None     MRN: [id number]   CSN: [id number]   ATTENDING: Joneen Boers, MD         Room: [id number]  Time Dictated: 1:10 PM  APP: Hollie Salk, PA-C        Chief Complaint      Chief Complaint       Patient presents with        ?  Abdominal Pain             History of Present Illness     33 y.o. female  with no prior medical history presenting to the emergency department for evaluation of abdominal pain.  Patient reports that she has been experiencing right lower quadrant abdominal pain which has been constant and progressive for the past week,  better  with lying down, worse with standing and walking, sharp in quality.  Patient reports that while she is lying at rest her pain is 4 out of 10 in severity, when she gets up and walks around it goes up to a 9 out of 10 in severity.  Patient reports that  she does have a history of endometriosis which was confirmed with laparoscopy in the remote past, denies any additional abdominal surgical history, still has both of her ovaries and her appendix, reports that this pain is not similar to cramping pain  that she has with her endometriosis in the past, reports that this is slightly higher in location, reports that she just came off of her menstrual cycle 2 days ago with no relief of her pain.  Reports that she  has tried ibuprofen without relief.  [facility], they sent her here for evaluation for possible appendicitis.  She has experienced associated nausea but denies vomiting or bowel changes. No fevers.        Review of Systems     Constitutional:  No fever, chills, body aches   ENT: No sore throat or runny nose.   Respiratory: No cough, shortness of breath, or wheezing   Cardiovascular: No chest pain or palpitations   Gastrointestinal: +abdominal pain, nausea.  No vomiting, diarrhea, or constipation   Genitourinary: No dysuria or frequency.   Musculoskeletal: No swelling or joint pain   Integumentary: No rashes or lesions   Hematologic: No bleeding/bruising complaints   Neurological: No headache or dizziness.        Past Medical/Surgical History          Past Medical History:        Diagnosis  Date         ?  Depression       ?  Endometriosis           ?  Psychiatric disorder            Past Surgical History:         Procedure  Laterality  Date          ?  HX PELVIC LAPAROSCOPY    2001             Social History          Social History          Socioeconomic History         ?  Marital status:  SINGLE              Spouse name:  Not on file         ?   Number of children:  Not on file     ?  Years of education:  Not on file     ?  Highest education level:  Not on file       Tobacco Use         ?  Smoking status:  Never Smoker     ?  Smokeless tobacco:  Never Used       Substance and Sexual Activity         ?  Alcohol use:  Yes              Frequency:  Monthly or less             Comment: occasionally         ?  Drug use:  Not Currently              Types:  Marijuana             Comment: 1 blunt daily         ?  Sexual activity:  Yes             Family History          Family History         Problem  Relation  Age of Onset          ?  No Known Problems  Mother            ?  No Known Problems  Father  Current Medications          Prior to Admission Medications     Prescriptions  Last Dose  Informant  Patient Reported?  Taking?      cariprazine HCl (VRAYLAR PO)      Yes  No      Sig: Take  by mouth.      ondansetron (ZOFRAN ODT) 4 mg disintegrating tablet      No  No      Sig: Take 1 Tab by mouth every eight (8) hours as needed for Nausea. Indications: nausea               Facility-Administered Medications: None             Allergies     No Known Allergies        Physical Exam          ED Triage Vitals     ED Encounter Vitals Group            BP  11/05/18 1241  (!) 119/6        Pulse (Heart Rate)  11/05/18 1241  79        Resp Rate  11/05/18 1241  20        Temp  11/05/18 1241  97.8 ??F (36.6 ??C)        Temp src  --          O2 Sat (%)  11/05/18 1241  100 %        Weight  11/05/18 1234  141 lb            Height  11/05/18 1234  '5\' 3"'$         Constitutional: Pleasant occasionally tearful nontoxic-appearing female seated upright on stretcher   HEENT: Conjunctiva clear.  Mucous membranes moist, non-erythematous. Surface of the pharynx, palate, and tongue are pink, moist and without  lesions.   Neck: supple, non tender, symmetrical, no masses, JVD, or lymphadenopathy    Respiratory: lungs clear to auscultation, nonlabored respirations. No tachypnea or accessory  muscle use.   Cardiovascular: heart regular rate and rhythm without murmur rubs or gallops.      Gastrointestinal:  Soft, tender with palpation right lower quadrant and right mid abdomen. Non-distended, hyperactive bowel sounds. No CVA  tenderness, positive heel tap   Musculoskeletal: Calves soft and non-tender. No peripheral edema or significant varicosities.   Pulses:  Radial and DP pulses 2+ and equal bilaterally.    Integumentary: warm and dry, no jaundice, no rashes or lesions   Neurologic: alert and oriented, no focal weakness.           Impression and Management Plan     33 year old female presenting to the emergency department for evaluation of 1 week of right lower quadrant abdominal pain with associated nausea no vomiting.  On  evaluation, vitals within normal limits, patient is nontoxic-appearing, abdomen is tender with palpation as described above with hyperactive bowel sounds.  Will obtain CBC, CMP, lipase, urinalysis, urine pregnancy test, CT of the abdomen and pelvis with  contrast for further evaluation, will hydrate and treat symptomatically.        Diagnostic Studies     Lab:      Recent Results (from the past 12 hour(s))     CBC WITH AUTOMATED DIFF          Collection Time: 11/05/18  1:25 PM         Result  Value  Ref  Range            WBC  5.9  4.0 - 11.0 1000/mm3       RBC  4.08  3.60 - 5.20 M/uL       HGB  13.0  13.0 - 17.2 gm/dl       HCT  38.9  37.0 - 50.0 %       MCV  95.3  80.0 - 98.0 fL       MCH  31.9  25.4 - 34.6 pg       MCHC  33.4  30.0 - 36.0 gm/dl       PLATELET  265  140 - 450 1000/mm3       MPV  9.1  6.0 - 10.0 fL       RDW-SD  44.0  36.4 - 46.3         NRBC  0  0 - 0         IMMATURE GRANULOCYTES  0.2  0.0 - 3.0 %       NEUTROPHILS  68.3 (H)  34 - 64 %       LYMPHOCYTES  22.3 (L)  28 - 48 %       MONOCYTES  8.4  1 - 13 %       EOSINOPHILS  0.5  0 - 5 %       BASOPHILS  0.3  0 - 3 %       METABOLIC PANEL, COMPREHENSIVE          Collection Time: 11/05/18  1:25 PM         Result   Value  Ref Range            Sodium  140  136 - 145 mEq/L       Potassium  4.1  3.5 - 5.1 mEq/L       Chloride  106  98 - 107 mEq/L       CO2  26  21 - 32 mEq/L       Glucose  87  74 - 106 mg/dl       BUN  17  7 - 25 mg/dl       Creatinine  0.9  0.6 - 1.3 mg/dl       GFR est AA  >60.0          GFR est non-AA  >60          Calcium  9.4  8.5 - 10.1 mg/dl       AST (SGOT)  12 (L)  15 - 37 U/L       ALT (SGPT)  18  12 - 78 U/L       Alk. phosphatase  91  45 - 117 U/L       Bilirubin, total  0.4  0.2 - 1.0 mg/dl       Protein, total  8.0  6.4 - 8.2 gm/dl       Albumin  3.6  3.4 - 5.0 gm/dl       Anion gap  9  5 - 15 mmol/L       LIPASE          Collection Time: 11/05/18  1:25 PM         Result  Value  Ref Range            Lipase  84  73 - 393 U/L       POC  HCG,URINE          Collection Time: 11/05/18  4:36 PM         Result  Value  Ref Range            HCG urine, QL  negative  NEGATIVE,Negative,negative         POC URINE MACROSCOPIC          Collection Time: 11/05/18  4:39 PM         Result  Value  Ref Range            Glucose  Negative  NEGATIVE,Negative mg/dl       Bilirubin  Negative  NEGATIVE,Negative         Ketone  15 (A)  NEGATIVE,Negative mg/dl       Specific gravity  1.015  1.005 - 1.030         Blood  Trace-intact (A)  NEGATIVE,Negative         pH (UA)  7.5  5 - 9         Protein  Negative  NEGATIVE,Negative mg/dl       Urobilinogen  0.2  0.0 - 1.0 EU/dl       Nitrites  Negative  NEGATIVE,Negative         Leukocyte Esterase  Negative  NEGATIVE,Negative         Color  Yellow               Appearance  Clear           Imaging:     Ct Abd Pelv W Cont      Addendum Date: 11/05/2018     Correlation is made with ultrasound 11/05/2018: 1. 5.7 x 5.5 cm hemorrhagic cyst right ovary. 2. 3 x 2 cm uterine fibroid.       Result Date: 11/05/2018   DICOM format image data is available to non-affiliated external healthcare facilities or entities on a secure, media free, reciprocally searchable basis with patient authorization for 12  months following the date of the study. Clinical history: Right  lower quadrant pain EXAMINATION: CT scan of the abdomen and pelvis with intravenous contrast 11/05/2018. 5 mm spiral scanning is performed from the costophrenic angles to the symphysis pubis. Coronal and sagittal reconstruction imaging has been obtained.  Correlation: None FINDINGS: Lung bases are clear. Liver, gallbladder, spleen, pancreas, adrenal glands, kidneys, bladder and uterus is unremarkable. 5.7 x 5.5 cm cyst right ovary 2.5 x 1.3 cm enhancing cyst right ovary. Follicles left ovary. Colon is  fluid-filled. Terminal ileum, appendix, stomach and small bowel loops are within normal limits. Abdominal aorta is unremarkable. No dominant lymph node enlargement. Small amounts of free fluid in the pelvis.       IMPRESSION: 1. 5.7 cm and 2.5 cm cyst right ovary. 2. Small amounts of free fluid in the pelvis. 3. Appendix within normal limits.       US Transvaginal      Result Date: 11/05/2018   Clinical history: Right lower quadrant pain, ovarian cyst EXAMINATION: Endovaginal pelvic ultrasound with color duplex interrogation and spectral waveform analysis 11/05/2018 Correlation: CT scan 11/05/2018 FINDINGS: Uterus measures 6.4 x 3.8 x 4 cm. Endometrium  measures 6 mm. 3.1 x 2.3 x 3.4 cm uterine fibroid. Left ovary measures 3.4 x 2.3 x 2.4 cm. Follicles are seen in the left ovary. Right ovary measures 5.4 x 4 x 5.4 cm. 4.8 x 4.1 x 4.9 cm cyst with internal echoes right ovary without  flow likely represents  hemorrhagic cyst. On color-flow duplex interrogation there is blood for to both ovaries. Small amounts of free fluid in the pelvis.       IMPRESSION: 1. 4.9 cm hemorrhagic cyst right ovary, 6-8 week follow-up ultrasound suggested to confirm resolution. 2. Small amounts of free fluid in the pelvis. 3. 3.4 cm uterine fibroid.               ED Course/Medical Decision Making     Patient with a normal white blood cell count 5.9, hemoglobin is 13.0, hematocrit is  3.8, remainder CBC is likewise unremarkable.  Electrolytes grossly within normal  limits, renal function and liver function tests are normal as well.  Patient is not pregnant.  Urinalysis with trace intact blood, some ketones, negative nitrites, negative leukocyte esterase.  CT of the abdomen and pelvis shows the appendix is within  normal limits, she is a 5.7 cm and 2.5 cm cyst of the right ovary with small amount of free fluid in the pelvis.  Transvaginal ultrasound subsequently obtained to further characterize, this reveals 4.9 cm hemorrhagic cyst of the right ovary with recommendation  for 6 to 8-week follow-up ultrasound to confirm resolution.  Discussed all these results with the patient who is still in pretty significant discomfort.  Patient was given a dose of Norco with a prescription for the same, also with a prescription for  600 mg ibuprofen and instructions to follow-up with her OB/GYN provider.  Patient reports that she has a follow-up appointment scheduled in 5 days time with her OB/GYN, Dr. Martinique.  She is recommended to return to the emergency department with new symptoms  of concern, she is comfortable with this plan, remained stable during her time in the emergency department, was discharged home in good condition.        Final Diagnosis                 ICD-10-CM  ICD-9-CM          1.  Hemorrhagic ovarian cyst  N83.209  620.2             Disposition     Discharged home      [place], [place]   Nov 05, 2018      The patient was personally evaluated by myself and Dr. Alisa Graff who agrees with the above assessment and plan.      My signature above authenticates this document and my orders, the final diagnosis (es), discharge prescription (s), and instructions in the Epic record. If you have any questions please contact [phone].   ??   Nursing notes have been reviewed by the physician/ advanced practice clinician.

## 2018-11-05 NOTE — ED Notes (Signed)
RLQ pain that started Tuesday, worsening today.  Seen at [facility] , sent here for possible appendicitis.

## 2018-11-05 NOTE — ED Triage Notes (Signed)
RLQ pain that started Tuesday, worsening today.  Seen at [facility] , sent here for possible appendicitis.

## 2018-11-05 NOTE — ED Provider Notes (Signed)
[facility]  Emergency Department Treatment Report    Patient: Sheila Edwards Age: 33 y.o. Sex: female    Date of Birth: [birth year] Admit Date: 11/05/2018 PCP: None   MRN: [id number]  CSN: [id number]  ATTENDING: Joneen Boers, MD   Room: [phone] Time Dictated: 1:10 PM APP: Hollie Salk, PA-C     Chief Complaint   Chief Complaint   Patient presents with   ??? Abdominal Pain       History of Present Illness   33 y.o. female with no prior medical history presenting to the emergency department for evaluation of abdominal pain.  Patient reports that she has been experiencing right lower quadrant abdominal pain which has been constant and progressive for the past week, better with lying down, worse with standing and walking, sharp in quality.  Patient reports that while she is lying at rest her pain is 4 out of 10 in severity, when she gets up and walks around it goes up to a 9 out of 10 in severity.  Patient reports that she does have a history of endometriosis which was confirmed with laparoscopy in the remote past, denies any additional abdominal surgical history, still has both of her ovaries and her appendix, reports that this pain is not similar to cramping pain that she has with her endometriosis in the past, reports that this is slightly higher in location, reports that she just came off of her menstrual cycle 2 days ago with no relief of her pain.  Reports that she has tried ibuprofen without relief.  [facility], they sent her here for evaluation for possible appendicitis.  She has experienced associated nausea but denies vomiting or bowel changes. No fevers.    Review of Systems   Constitutional:  No fever, chills, body aches  ENT: No sore throat or runny nose.  Respiratory: No cough, shortness of breath, or wheezing  Cardiovascular: No chest pain or palpitations  Gastrointestinal: +abdominal pain, nausea. No vomiting, diarrhea, or  constipation  Genitourinary: No dysuria or frequency.  Musculoskeletal: No swelling or joint pain  Integumentary: No rashes or lesions  Hematologic: No bleeding/bruising complaints  Neurological: No headache or dizziness.    Past Medical/Surgical History     Past Medical History:   Diagnosis Date   ??? Depression    ??? Endometriosis    ??? Psychiatric disorder      Past Surgical History:   Procedure Laterality Date   ??? HX PELVIC LAPAROSCOPY  2001       Social History     Social History     Socioeconomic History   ??? Marital status: SINGLE     Spouse name: Not on file   ??? Number of children: Not on file   ??? Years of education: Not on file   ??? Highest education level: Not on file   Tobacco Use   ??? Smoking status: Never Smoker   ??? Smokeless tobacco: Never Used   Substance and Sexual Activity   ??? Alcohol use: Yes     Frequency: Monthly or less     Comment: occasionally   ??? Drug use: Not Currently     Types: Marijuana     Comment: 1 blunt daily   ??? Sexual activity: Yes       Family History     Family History   Problem Relation Age of Onset   ??? No Known Problems Mother    ???  No Known Problems Father        Current Medications     Prior to Admission Medications   Prescriptions Last Dose Informant Patient Reported? Taking?   cariprazine HCl (VRAYLAR PO)   Yes No   Sig: Take  by mouth.   ondansetron (ZOFRAN ODT) 4 mg disintegrating tablet   No No   Sig: Take 1 Tab by mouth every eight (8) hours as needed for Nausea. Indications: nausea      Facility-Administered Medications: None       Allergies   No Known Allergies    Physical Exam     ED Triage Vitals   ED Encounter Vitals Group      BP 11/05/18 1241 (!) 119/6      Pulse (Heart Rate) 11/05/18 1241 79      Resp Rate 11/05/18 1241 20      Temp 11/05/18 1241 97.8 ??F (36.6 ??C)      Temp src --       O2 Sat (%) 11/05/18 1241 100 %      Weight 11/05/18 1234 141 lb      Height 11/05/18 1234 5' 3"      Constitutional: Pleasant occasionally tearful nontoxic-appearing female  seated upright on stretcher  HEENT: Conjunctiva clear.  Mucous membranes moist, non-erythematous. Surface of the pharynx, palate, and tongue are pink, moist and without lesions.  Neck: supple, non tender, symmetrical, no masses, JVD, or lymphadenopathy   Respiratory: lungs clear to auscultation, nonlabored respirations. No tachypnea or accessory muscle use.  Cardiovascular: heart regular rate and rhythm without murmur rubs or gallops.     Gastrointestinal:  Soft, tender with palpation right lower quadrant and right mid abdomen. Non-distended, hyperactive bowel sounds. No CVA tenderness, positive heel tap  Musculoskeletal: Calves soft and non-tender. No peripheral edema or significant varicosities.  Pulses:  Radial and DP pulses 2+ and equal bilaterally.   Integumentary: warm and dry, no jaundice, no rashes or lesions  Neurologic: alert and oriented, no focal weakness.       Impression and Management Plan   33 year old female presenting to the emergency department for evaluation of 1 week of right lower quadrant abdominal pain with associated nausea no vomiting.  On evaluation, vitals within normal limits, patient is nontoxic-appearing, abdomen is tender with palpation as described above with hyperactive bowel sounds.  Will obtain CBC, CMP, lipase, urinalysis, urine pregnancy test, CT of the abdomen and pelvis with contrast for further evaluation, will hydrate and treat symptomatically.    Diagnostic Studies   Lab:   Recent Results (from the past 12 hour(s))   CBC WITH AUTOMATED DIFF    Collection Time: 11/05/18  1:25 PM   Result Value Ref Range    WBC 5.9 4.0 - 11.0 1000/mm3    RBC 4.08 3.60 - 5.20 M/uL    HGB 13.0 13.0 - 17.2 gm/dl    HCT 38.9 37.0 - 50.0 %    MCV 95.3 80.0 - 98.0 fL    MCH 31.9 25.4 - 34.6 pg    MCHC 33.4 30.0 - 36.0 gm/dl    PLATELET 265 140 - 450 1000/mm3    MPV 9.1 6.0 - 10.0 fL    RDW-SD 44.0 36.4 - 46.3      NRBC 0 0 - 0      IMMATURE GRANULOCYTES 0.2 0.0 - 3.0 %     NEUTROPHILS 68.3 (H) 34 - 64 %    LYMPHOCYTES 22.3 (L) 28 - 48 %  MONOCYTES 8.4 1 - 13 %    EOSINOPHILS 0.5 0 - 5 %    BASOPHILS 0.3 0 - 3 %   METABOLIC PANEL, COMPREHENSIVE    Collection Time: 11/05/18  1:25 PM   Result Value Ref Range    Sodium 140 136 - 145 mEq/L    Potassium 4.1 3.5 - 5.1 mEq/L    Chloride 106 98 - 107 mEq/L    CO2 26 21 - 32 mEq/L    Glucose 87 74 - 106 mg/dl    BUN 17 7 - 25 mg/dl    Creatinine 0.9 0.6 - 1.3 mg/dl    GFR est AA >60.0      GFR est non-AA >60      Calcium 9.4 8.5 - 10.1 mg/dl    AST (SGOT) 12 (L) 15 - 37 U/L    ALT (SGPT) 18 12 - 78 U/L    Alk. phosphatase 91 45 - 117 U/L    Bilirubin, total 0.4 0.2 - 1.0 mg/dl    Protein, total 8.0 6.4 - 8.2 gm/dl    Albumin 3.6 3.4 - 5.0 gm/dl    Anion gap 9 5 - 15 mmol/L   LIPASE    Collection Time: 11/05/18  1:25 PM   Result Value Ref Range    Lipase 84 73 - 393 U/L   POC HCG,URINE    Collection Time: 11/05/18  4:36 PM   Result Value Ref Range    HCG urine, QL negative NEGATIVE,Negative,negative     POC URINE MACROSCOPIC    Collection Time: 11/05/18  4:39 PM   Result Value Ref Range    Glucose Negative NEGATIVE,Negative mg/dl    Bilirubin Negative NEGATIVE,Negative      Ketone 15 (A) NEGATIVE,Negative mg/dl    Specific gravity 1.015 1.005 - 1.030      Blood Trace-intact (A) NEGATIVE,Negative      pH (UA) 7.5 5 - 9      Protein Negative NEGATIVE,Negative mg/dl    Urobilinogen 0.2 0.0 - 1.0 EU/dl    Nitrites Negative NEGATIVE,Negative      Leukocyte Esterase Negative NEGATIVE,Negative      Color Yellow      Appearance Clear       Imaging:    Ct Abd Pelv W Cont    Addendum Date: 11/05/2018    Correlation is made with ultrasound 11/05/2018: 1. 5.7 x 5.5 cm hemorrhagic cyst right ovary. 2. 3 x 2 cm uterine fibroid.     Result Date: 11/05/2018  DICOM format image data is available to non-affiliated external healthcare facilities or entities on a secure, media free, reciprocally searchable  basis with patient authorization for 12 months following the date of the study. Clinical history: Right lower quadrant pain EXAMINATION: CT scan of the abdomen and pelvis with intravenous contrast 11/05/2018. 5 mm spiral scanning is performed from the costophrenic angles to the symphysis pubis. Coronal and sagittal reconstruction imaging has been obtained. Correlation: None FINDINGS: Lung bases are clear. Liver, gallbladder, spleen, pancreas, adrenal glands, kidneys, bladder and uterus is unremarkable. 5.7 x 5.5 cm cyst right ovary 2.5 x 1.3 cm enhancing cyst right ovary. Follicles left ovary. Colon is fluid-filled. Terminal ileum, appendix, stomach and small bowel loops are within normal limits. Abdominal aorta is unremarkable. No dominant lymph node enlargement. Small amounts of free fluid in the pelvis.     IMPRESSION: 1. 5.7 cm and 2.5 cm cyst right ovary. 2. Small amounts of free fluid in the pelvis. 3.  Appendix within normal limits.     US Transvaginal    Result Date: 11/05/2018  Clinical history: Right lower quadrant pain, ovarian cyst EXAMINATION: Endovaginal pelvic ultrasound with color duplex interrogation and spectral waveform analysis 11/05/2018 Correlation: CT scan 11/05/2018 FINDINGS: Uterus measures 6.4 x 3.8 x 4 cm. Endometrium measures 6 mm. 3.1 x 2.3 x 3.4 cm uterine fibroid. Left ovary measures 3.4 x 2.3 x 2.4 cm. Follicles are seen in the left ovary. Right ovary measures 5.4 x 4 x 5.4 cm. 4.8 x 4.1 x 4.9 cm cyst with internal echoes right ovary without flow likely represents hemorrhagic cyst. On color-flow duplex interrogation there is blood for to both ovaries. Small amounts of free fluid in the pelvis.     IMPRESSION: 1. 4.9 cm hemorrhagic cyst right ovary, 6-8 week follow-up ultrasound suggested to confirm resolution. 2. Small amounts of free fluid in the pelvis. 3. 3.4 cm uterine fibroid.         ED Course/Medical Decision Making   Patient with a normal white blood cell count 5.9, hemoglobin is 13.0,  hematocrit is 3.8, remainder CBC is likewise unremarkable.  Electrolytes grossly within normal limits, renal function and liver function tests are normal as well.  Patient is not pregnant.  Urinalysis with trace intact blood, some ketones, negative nitrites, negative leukocyte esterase.  CT of the abdomen and pelvis shows the appendix is within normal limits, she is a 5.7 cm and 2.5 cm cyst of the right ovary with small amount of free fluid in the pelvis.  Transvaginal ultrasound subsequently obtained to further characterize, this reveals 4.9 cm hemorrhagic cyst of the right ovary with recommendation for 6 to 8-week follow-up ultrasound to confirm resolution.  Discussed all these results with the patient who is still in pretty significant discomfort.  Patient was given a dose of Norco with a prescription for the same, also with a prescription for 600 mg ibuprofen and instructions to follow-up with her OB/GYN provider.  Patient reports that she has a follow-up appointment scheduled in 5 days time with her OB/GYN, Dr. [place].  She is recommended to return to the emergency department with new symptoms of concern, she is comfortable with this plan, remained stable during her time in the emergency department, was discharged home in good condition.    Final Diagnosis       ICD-10-CM ICD-9-CM   1. Hemorrhagic ovarian cyst N83.209 620.2       Disposition   Discharged home    [place], [place]  Nov 05, 2018    The patient was personally evaluated by myself and Dr. Alisa Graff who agrees with the above assessment and plan.    My signature above authenticates this document and my orders, the final diagnosis (es), discharge prescription (s), and instructions in the Epic record. If you have any questions please contact [phone].  ??  Nursing notes have been reviewed by the physician/ advanced practice clinician.

## 2020-01-30 ENCOUNTER — Inpatient Hospital Stay
Admit: 2020-01-30 | Discharge: 2020-01-31 | Disposition: A | Discharge status: Home / Self Care | Payer: PRIVATE HEALTH INSURANCE | Attending: Emergency Medicine

## 2020-01-30 ENCOUNTER — Emergency Department: Admit: 2020-01-31 | Payer: PRIVATE HEALTH INSURANCE

## 2020-01-30 ENCOUNTER — Emergency Department: Admit: 2020-01-30 | Payer: PRIVATE HEALTH INSURANCE

## 2020-01-30 DIAGNOSIS — N83201 Unspecified ovarian cyst, right side: Secondary | ICD-10-CM

## 2020-01-30 LAB — POC URINE MACROSCOPIC
Bilirubin, Urine: NEGATIVE
Bilirubin: NEGATIVE
Glucose, Ur: NEGATIVE mg/dl
Glucose: NEGATIVE mg/dl
Ketone: 15 mg/dl — AB
Ketones, Urine: 15 mg/dl — AB
Leukocyte Esterase, Urine: NEGATIVE
Leukocyte Esterase: NEGATIVE
Nitrite, Urine: NEGATIVE
Nitrites: NEGATIVE
Protein, UA: NEGATIVE mg/dl
Protein: NEGATIVE mg/dl
Specific Gravity, UA: 1.025 (ref 1.005–1.030)
Specific gravity: 1.025 (ref 1.005–1.030)
Urobilinogen, UA, POCT: 0.2 EU/dl (ref 0.0–1.0)
Urobilinogen: 0.2 EU/dl (ref 0.0–1.0)
pH (UA): 7 (ref 5–9)
pH, UA: 7 (ref 5–9)

## 2020-01-30 LAB — COMPREHENSIVE METABOLIC PANEL
ALT: 17 U/L (ref 12–78)
AST: 13 U/L — ABNORMAL LOW (ref 15–37)
Albumin: 4.5 gm/dl (ref 3.4–5.0)
Alkaline Phosphatase: 86 U/L (ref 45–117)
Anion Gap: 4 mmol/L — ABNORMAL LOW (ref 5–15)
BUN: 15 mg/dl (ref 7–25)
CO2: 26 mEq/L (ref 21–32)
Calcium: 9.2 mg/dl (ref 8.5–10.1)
Chloride: 106 mEq/L (ref 98–107)
Creatinine: 0.8 mg/dl (ref 0.6–1.3)
EGFR IF NonAfrican American: >60
GFR African American: >60
Glucose: 88 mg/dl (ref 74–106)
Potassium: 3.7 mEq/L (ref 3.5–5.1)
Sodium: 135 mEq/L — ABNORMAL LOW (ref 136–145)
Total Bilirubin: 0.6 mg/dl (ref 0.2–1.0)
Total Protein: 8.2 gm/dl (ref 6.4–8.2)

## 2020-01-30 LAB — CBC WITH AUTO DIFFERENTIAL
Basophils %: 0.6 % (ref 0–3)
Eosinophils %: 0.8 % (ref 0–5)
Hematocrit: 40.5 % (ref 37.0–50.0)
Hemoglobin: 13.8 gm/dl (ref 13.0–17.2)
Immature Granulocytes: 0.3 % (ref 0.0–3.0)
Lymphocytes %: 33.9 % (ref 28–48)
MCH: 31.7 pg (ref 25.4–34.6)
MCHC: 34.1 gm/dl (ref 30.0–36.0)
MCV: 92.9 fL (ref 80.0–98.0)
MPV: 9.4 fL (ref 6.0–10.0)
Monocytes %: 4.7 % (ref 1–13)
Neutrophils %: 59.7 % (ref 34–64)
Nucleated RBCs: 0 (ref 0–0)
Platelets: 277 10*3/uL (ref 140–450)
RBC: 4.36 M/uL (ref 3.60–5.20)
RDW-SD: 44.7 (ref 36.4–46.3)
WBC: 7.2 10*3/uL (ref 4.0–11.0)

## 2020-01-30 LAB — POC HCG,URINE
HCG urine, QL: NEGATIVE
Pregnancy Test(Urn): NEGATIVE

## 2020-01-30 LAB — LIPASE
Lipase: 105 U/L (ref 73–393)
Lipase: 105 U/L (ref 73–393)

## 2020-01-30 LAB — CBC WITH AUTOMATED DIFF
BASOPHILS: 0.6 % (ref 0–3)
EOSINOPHILS: 0.8 % (ref 0–5)
HCT: 40.5 % (ref 37.0–50.0)
HGB: 13.8 gm/dl (ref 13.0–17.2)
IMMATURE GRANULOCYTES: 0.3 % (ref 0.0–3.0)
LYMPHOCYTES: 33.9 % (ref 28–48)
MCH: 31.7 pg (ref 25.4–34.6)
MCHC: 34.1 gm/dl (ref 30.0–36.0)
MCV: 92.9 fL (ref 80.0–98.0)
MONOCYTES: 4.7 % (ref 1–13)
MPV: 9.4 fL (ref 6.0–10.0)
NEUTROPHILS: 59.7 % (ref 34–64)
NRBC: 0 (ref 0–0)
PLATELET: 277 10*3/uL (ref 140–450)
RBC: 4.36 M/uL (ref 3.60–5.20)
RDW-SD: 44.7 (ref 36.4–46.3)
WBC: 7.2 10*3/uL (ref 4.0–11.0)

## 2020-01-30 LAB — METABOLIC PANEL, COMPREHENSIVE
ALT (SGPT): 17 U/L (ref 12–78)
AST (SGOT): 13 U/L — ABNORMAL LOW (ref 15–37)
Albumin: 4.5 gm/dl (ref 3.4–5.0)
Alk. phosphatase: 86 U/L (ref 45–117)
Anion gap: 4 mmol/L — ABNORMAL LOW (ref 5–15)
BUN: 15 mg/dl (ref 7–25)
Bilirubin, total: 0.6 mg/dl (ref 0.2–1.0)
CO2: 26 mEq/L (ref 21–32)
Calcium: 9.2 mg/dl (ref 8.5–10.1)
Chloride: 106 mEq/L (ref 98–107)
Creatinine: 0.8 mg/dl (ref 0.6–1.3)
GFR est AA: >60
GFR est non-AA: >60
Glucose: 88 mg/dl (ref 74–106)
Potassium: 3.7 mEq/L (ref 3.5–5.1)
Protein, total: 8.2 gm/dl (ref 6.4–8.2)
Sodium: 135 mEq/L — ABNORMAL LOW (ref 136–145)

## 2020-01-30 MED ORDER — MORPHINE 4 MG/ML SYRINGE
4 mg/mL | INTRAMUSCULAR | Status: AC
Start: 2020-01-30 — End: 2020-01-30
  Administered 2020-01-30: 20:00:00 via INTRAVENOUS

## 2020-01-30 MED ORDER — ONDANSETRON (PF) 4 MG/2 ML INJECTION
4 mg/2 mL | Freq: Once | INTRAMUSCULAR | Status: AC
Start: 2020-01-30 — End: 2020-01-30
  Administered 2020-01-30: 20:00:00 via INTRAVENOUS

## 2020-01-30 MED ORDER — SODIUM CHLORIDE 0.9 % IJ SYRG
Freq: Once | INTRAMUSCULAR | Status: AC
Start: 2020-01-30 — End: 2020-01-30
  Administered 2020-01-30: 21:00:00 via INTRAVENOUS

## 2020-01-30 MED ORDER — IOPAMIDOL 61 % IV SOLN
61 % | Freq: Once | INTRAVENOUS | Status: AC
Start: 2020-01-30 — End: 2020-01-30
  Administered 2020-01-30: 23:00:00 via INTRAVENOUS

## 2020-01-30 MED ORDER — HYDROMORPHONE 1 MG/ML INJECTION SOLUTION
1 mg/mL | INTRAMUSCULAR | Status: AC
Start: 2020-01-30 — End: 2020-01-30
  Administered 2020-01-30: 21:00:00 via INTRAVENOUS

## 2020-01-30 MED ORDER — KETOROLAC TROMETHAMINE 30 MG/ML INJECTION
30 mg/mL (1 mL) | INTRAMUSCULAR | Status: AC
Start: 2020-01-30 — End: 2020-01-30
  Administered 2020-01-30: 20:00:00 via INTRAVENOUS

## 2020-01-30 MED ORDER — SODIUM CHLORIDE 0.9% BOLUS IV
0.9 % | INTRAVENOUS | Status: AC
Start: 2020-01-30 — End: 2020-01-30
  Administered 2020-01-30: 20:00:00 via INTRAVENOUS

## 2020-01-30 MED FILL — MORPHINE 4 MG/ML SYRINGE: 4 mg/mL | INTRAMUSCULAR | Qty: 1

## 2020-01-30 MED FILL — SODIUM CHLORIDE 0.9 % IV: INTRAVENOUS | Qty: 1000

## 2020-01-30 MED FILL — KETOROLAC TROMETHAMINE 30 MG/ML INJECTION: 30 mg/mL (1 mL) | INTRAMUSCULAR | Qty: 1

## 2020-01-30 MED FILL — HYDROMORPHONE 1 MG/ML INJECTION SOLUTION: 1 mg/mL | INTRAMUSCULAR | Qty: 1

## 2020-01-30 MED FILL — ISOVUE-300  61 % INTRAVENOUS SOLUTION: 300 mg iodine /mL (61 %) | INTRAVENOUS | Qty: 85

## 2020-01-30 MED FILL — ONDANSETRON (PF) 4 MG/2 ML INJECTION: 4 mg/2 mL | INTRAMUSCULAR | Qty: 2

## 2020-01-30 NOTE — ED Notes (Signed)
Pt's wife went home and friend by bedside.  Pt asked how soon could she expect U/S.  They have 4 people ahead of her.  Communicated to patient.  Gave verbal understanding.

## 2020-01-30 NOTE — ED Notes (Signed)
Went to introduce self to patient.  Patient and partner are resting eyes closed.  Will continue to monitor.

## 2020-01-30 NOTE — ED Provider Notes (Signed)
[facility]  Emergency Department Treatment Report    Patient: Sheila Edwards Age: 34 y.o. Sex: female    Date of Birth: 1986-03-25 Admit Date: 01/30/2020 PCP: None   MRN: [id number]  CSN: [id number]  ATTENDING: Joice Lofts, MD   Room: 112/EO12 Time Dictated: 3:02 PM APP: Hollie Salk, PA-C     Chief Complaint   Chief Complaint   Patient presents with   ??? Abdominal Pain       History of Present Illness   34 y.o. female with a history of endometriosis presenting to the ED for evaluation of abdominal pain.  Patient states that she has been experiencing right lower quadrant abdominal pain for the past 3 days.  She attributed this to her endometriosis but pain was much more severe this morning, woke her from sleep, radiating into her right low back rated 8 out of 10 in severity.  Constant but with intermittent sharp shooting pains.  Associated nausea but no vomiting.  Patient states that she had some diarrhea yesterday but none so far today.  Denies irritative voiding symptoms.  LMP was approximately 1 week ago, patient denies possibility of pregnancy.  No history of abdominal surgeries.    Review of Systems   Constitutional:  No fever, chills, body aches  ENT: No sore throat or runny nose  Respiratory: No cough, shortness of breath, or wheezing  Cardiovascular: No chest pain or palpitations  Gastrointestinal: +abdominal pain, nausea, diarrhea. No vomiting.  Genitourinary: No dysuria or frequency.  Musculoskeletal: No swelling or joint pain  Integumentary: No rashes or lesions  Hematologic: No easy bruising/bleeding.  Neurological: No headache or dizziness.    Past Medical/Surgical History     Past Medical History:   Diagnosis Date   ??? Depression    ??? Endometriosis    ??? Psychiatric disorder      Past Surgical History:   Procedure Laterality Date   ??? HX PELVIC LAPAROSCOPY  2001       Social History     Social History     Socioeconomic History   ??? Marital status: MARRIED     Spouse name: Not on file    ??? Number of children: Not on file   ??? Years of education: Not on file   ??? Highest education level: Not on file   Tobacco Use   ??? Smoking status: Never Smoker   ??? Smokeless tobacco: Never Used   Substance and Sexual Activity   ??? Alcohol use: Yes     Comment: occasionally   ??? Drug use: Yes     Types: Marijuana     Comment: 1 blunt daily   ??? Sexual activity: Yes     Social Determinants of Company secretary Strain:    ??? Difficulty of Paying Living Expenses:    Food Insecurity:    ??? Worried About Charity fundraiser in the Last Year:    ??? Arboriculturist in the Last Year:    Transportation Needs:    ??? Film/video editor (Medical):    ??? Lack of Transportation (Non-Medical):    Physical Activity:    ??? Days of Exercise per Week:    ??? Minutes of Exercise per Session:    Stress:    ??? Feeling of Stress :    Social Connections:    ??? Frequency of Communication with Friends and Family:    ??? Frequency of Social Gatherings with Friends and  Family:    ??? Attends Religious Services:    ??? Active Member of Clubs or Organizations:    ??? Attends Archivist Meetings:    ??? Marital Status:        Family History     Family History   Problem Relation Age of Onset   ??? No Known Problems Mother    ??? No Known Problems Father        Current Medications     Prior to Admission Medications   Prescriptions Last Dose Informant Patient Reported? Taking?   cariprazine HCl (VRAYLAR PO)   Yes No   Sig: Take  by mouth.   ibuprofen (MOTRIN) 600 mg tablet   No No   Sig: Take 1 Tab by mouth every six (6) hours as needed for Pain.   ondansetron (ZOFRAN ODT) 4 mg disintegrating tablet   No No   Sig: Take 1 Tab by mouth every eight (8) hours as needed for Nausea. Indications: nausea      Facility-Administered Medications: None       Allergies   No Known Allergies    Physical Exam     ED Triage Vitals [01/30/20 1416]   ED Encounter Vitals Group      BP 121/74      Pulse (Heart Rate) 73      Resp Rate 18      Temp 98.5 ??F (36.9 ??C)       Temp src       O2 Sat (%) 100 %      Weight 130 lb 1.1 oz      Height '5\' 3"'$      Constitutional: Patient is in distress secondary to pain, lying fetal position, tearful.  HEENT: Conjunctiva clear.  Mucous membranes moist, non-erythematous. Surface of the pharynx, palate, and tongue are pink, moist and without lesions.  Neck: supple, non tender, symmetrical, no masses or lymphadenopathy   Respiratory: lungs clear to auscultation, nonlabored respirations. No tachypnea or accessory muscle use.  Cardiovascular: heart regular rate and rhythm without murmur rubs or gallops.     Gastrointestinal:  Soft, tender with palpation RLQ, non-distended, normoactive bowel sounds  Musculoskeletal: Calves soft and non-tender. No peripheral edema or significant varicosities.  Pulses:  radial pulses 2+ and equal bilaterally.   Integumentary: warm and dry, no jaundice, no rashes or lesions  Neurologic: alert and oriented, no focal weakness.     Impression and Management Plan   34 year old female with a history of endometriosis presenting to the emergency department for evaluation of abdominal pain.  On exam, patient is in some distress secondary to pain, vitals are within normal limits, the abdomen is soft with tenderness in the right lower quadrant.  Will obtain basic laboratory studies, CT imaging, treat symptomatically and hydrate.    DDx: Including but not limited to appendicitis versus ureterolithiasis versus ovarian torsion versus ruptured ovarian cyst.  Diagnostic Studies   Lab:   Recent Results (from the past 12 hour(s))   CBC WITH AUTOMATED DIFF    Collection Time: 01/30/20  3:56 PM   Result Value Ref Range    WBC 7.2 4.0 - 11.0 1000/mm3    RBC 4.36 3.60 - 5.20 M/uL    HGB 13.8 13.0 - 17.2 gm/dl    HCT 40.5 37.0 - 50.0 %    MCV 92.9 80.0 - 98.0 fL    MCH 31.7 25.4 - 34.6 pg    MCHC 34.1 30.0 - 36.0 gm/dl  PLATELET 277 140 - 450 1000/mm3    MPV 9.4 6.0 - 10.0 fL    RDW-SD 44.7 36.4 - 46.3      NRBC 0 0 - 0      IMMATURE  GRANULOCYTES 0.3 0.0 - 3.0 %    NEUTROPHILS 59.7 34 - 64 %    LYMPHOCYTES 33.9 28 - 48 %    MONOCYTES 4.7 1 - 13 %    EOSINOPHILS 0.8 0 - 5 %    BASOPHILS 0.6 0 - 3 %   METABOLIC PANEL, COMPREHENSIVE    Collection Time: 01/30/20  3:56 PM   Result Value Ref Range    Sodium 135 (L) 136 - 145 mEq/L    Potassium 3.7 3.5 - 5.1 mEq/L    Chloride 106 98 - 107 mEq/L    CO2 26 21 - 32 mEq/L    Glucose 88 74 - 106 mg/dl    BUN 15 7 - 25 mg/dl    Creatinine 0.8 0.6 - 1.3 mg/dl    GFR est AA >60.0      GFR est non-AA >60      Calcium 9.2 8.5 - 10.1 mg/dl    AST (SGOT) 13 (L) 15 - 37 U/L    ALT (SGPT) 17 12 - 78 U/L    Alk. phosphatase 86 45 - 117 U/L    Bilirubin, total 0.6 0.2 - 1.0 mg/dl    Protein, total 8.2 6.4 - 8.2 gm/dl    Albumin 4.5 3.4 - 5.0 gm/dl    Anion gap 4 (L) 5 - 15 mmol/L   LIPASE    Collection Time: 01/30/20  3:56 PM   Result Value Ref Range    Lipase 105 73 - 393 U/L   POC URINE MACROSCOPIC    Collection Time: 01/30/20  4:34 PM   Result Value Ref Range    Glucose Negative NEGATIVE,Negative mg/dl    Bilirubin Negative NEGATIVE,Negative      Ketone 15 (A) NEGATIVE,Negative mg/dl    Specific gravity 1.025 1.005 - 1.030      Blood Trace-lysed (A) NEGATIVE,Negative      pH (UA) 7.0 5 - 9      Protein Negative NEGATIVE,Negative mg/dl    Urobilinogen 0.2 0.0 - 1.0 EU/dl    Nitrites Negative NEGATIVE,Negative      Leukocyte Esterase Negative NEGATIVE,Negative      Color Yellow      Appearance Clear     POC HCG,URINE    Collection Time: 01/30/20  5:52 PM   Result Value Ref Range    HCG urine, QL negative NEGATIVE,Negative,negative       Imaging:    CT ABD PELV W CONT    Result Date: 01/30/2020  Indication: rlq pain. Right lower, abdominal pain for 3 days. Nausea. History of endometriosis..     IMPRESSION: Right adnexal ring-enhancing lesion, correlate with beta hCG level 4 evaluation of ectopic pregnancy. Persistent complex right ovarian cyst. Fatty liver.. Comment: CT images of the abdomen and pelvis were obtained  following administration of intravenous contrast. This was compared with Nov 05, 2018.Marland Kitchen There is minimal subsegmental atelectasis at the lung bases. The liver is of low attenuation from fatty infiltration. The spleen pancreas adrenal glands and gallbladder are unremarkable. The kidneys, ureters and bladder are unremarkable. No radiopaque urinary tract calculi. There is a complex 4.5 x 5.3 cm right ovarian cyst measuring 39 Hounsfield units posterior behind the uterus similar to Nov 05, 2018. This compatible with a  hemorrhagic or proteinaceous cyst. Small left ovarian cysts are observed. A 12 mm round ring enhanced hyperdensity is present in the right side of the uterus. Differential considerations include ectopic pregnancy and hemorrhagic cyst. Correlate with patient's beta hCG levels. No bowel obstruction, free intraperitoneal air, free fluid or appendicitis. All CT exams at this facility use one or more dose reduction techniques including automatic exposure control, mA/kV adjustment per patient's size, or iterative reconstruction technique.     [place] PELV NON OB W TV    Result Date: 01/30/2020  Indication: pelvic pain, abnormal CT, include doppler.     IMPRESSION: Right ovarian cyst. Uterine fibroid. Comment: Sonography of the pelvis was performed. In addition 2-D pulse color flow Doppler spectral blood flow wave form analysis of the ovaries was obtained. This was compared with CT abdomen and pelvis January 30, 2020.. The uterus measures 8 x 6 x 6.3 cm. The endometrial canal measures 10 mm. It harbors a 3.5 x 3.2 cm subserosal fibroid with cystic center correlating with findings on CT.Marland Kitchen The right ovary measures 6.2 x 4.3 x 5.1 cm. It harbors a 5 x 4.3 x 3.8 cm cyst correlating with findings on CT.Marland Kitchen The left ovary measures 3.8 x 3.4 x 2.2 cm. Both ovaries are sonographically unremarkable.. No free fluid.       ED Course/Medical Decision Making   Patient has normal white blood cell count 7.2, H&H 13.8 and 40.5, platelets  277, remainder of CBC is unremarkable.  Electrolytes rest normal, renal function is normal, creatinine is 0.8, BUN is 15.  LFTs are normal.  Patient is nonpregnant.  Urinalysis with trace lysed blood, no evidence of infection.  CT of the abdomen and pelvis reveals no evidence of appendicitis but there is a right adnexal ring-enhancing lesion, cyst versus ectopic.  Again her urine pregnancy test is negative.  Patient requiring multiple doses of narcotics for pain control.  She is feeling better at this time however, her vitals have remained stable.  I have ordered a transvaginal ultrasound with Doppler to evaluate this abnormality, to evaluate for ovarian torsion.  Patient left in the care of Dr. Sonny Masters at the end of my shift, anticipate patient will be able to be discharged if ultrasound reveals normal ovarian blood flow.    Continuation by Sedonia Small, MD:  I, Dr. Sedonia Small, have personally seen and examined this patient; I have fully participated in the care of this patient with the advanced practice provider.  I have reviewed and agree with all pertinent clinical information including history, physical exam, labs, radiographic studies and the plan.  I have also reviewed and agree with the medications, allergies and past medical history sections for this patient.  Patient presents with complaints of right lower quadrant abdominal pain on exam patient tender in the right pelvis, no palpable masses rebound or guarding, CT and ultrasound documented as above work-up revealing right ovarian cyst, patient with history of endometriosis.  Will treat patient symptomatically with continued nonsteroidals as well as p.o. Norco, quantity of 10.  Patient instructed to follow-up closely with Dr. [place], recommend her calling in the morning for close follow-up and to return if uncontrolled pain or further problems.    Joice Lofts, MD        Final Diagnosis   Right ovarian cyst    Disposition   Discharged home    [place], [place]  January 30, 2020      The patient was personally evaluated by myself and  Alyona Romack, Joya Gaskins, MD who agrees with the above assessment and plan.    My signature above authenticates this document and my orders, the final diagnosis (es), discharge prescription (s), and instructions in the Epic record. If you have any questions please contact [phone].     Nursing notes have been reviewed by the physician/ advanced practice clinician.

## 2020-01-30 NOTE — ED Notes (Signed)
Pt returned from [place].  Dr.Siegal was at bedside and POC is discharge.  Also ok'd for water.  Waiting on papers

## 2020-01-30 NOTE — ED Notes (Signed)
Pt in Ultrasound.

## 2020-01-30 NOTE — ED Notes (Signed)
Discharge instructions given to patient (name) with verbalization of understanding.  Patient accompanied by friend. Patient discharged with the following prescriptions: norco. Patient discharged to home(destination).

## 2020-01-30 NOTE — ED Notes (Signed)
RLQ abd pain x 3 days . x2 days of R lower back pain. Also reports nausea.

## 2020-01-30 NOTE — ED Notes (Signed)
Received papers from Dr. Sharen Hint    Discharge instructions given to patient (name) with verbalization of understanding.  Patient accompanied by friend. Patient discharged with the following prescriptions: norco. Patient discharged to home(destination).

## 2020-01-31 MED ORDER — HYDROCODONE-ACETAMINOPHEN 5 MG-325 MG TAB
5-325 mg | ORAL_TABLET | Freq: Four times a day (QID) | ORAL | 0 refills | Status: AC | PRN
Start: 2020-01-31 — End: 2020-02-02

## 2020-01-31 MED ORDER — IBUPROFEN 600 MG TAB
600 mg | ORAL_TABLET | Freq: Four times a day (QID) | ORAL | 0 refills | Status: AC | PRN
Start: 2020-01-31 — End: ?

## 2020-01-31 MED ORDER — HYDROCODONE-ACETAMINOPHEN 5 MG-325 MG TAB
5-325 mg | ORAL_TABLET | Freq: Four times a day (QID) | ORAL | 0 refills | Status: DC | PRN
Start: 2020-01-31 — End: 2020-01-30

## 2020-02-20 ENCOUNTER — Encounter: Attending: Family
